# Patient Record
Sex: Male | Born: 1935 | Race: White | Hispanic: No | Marital: Married | Smoking: Former smoker
Health system: Southern US, Community
[De-identification: ages and names within clinical notes are randomized; demographics above are authoritative.]

## PROBLEM LIST (undated history)

## (undated) DIAGNOSIS — H9190 Unspecified hearing loss, unspecified ear: Secondary | ICD-10-CM

## (undated) DIAGNOSIS — C4490 Unspecified malignant neoplasm of skin, unspecified: Secondary | ICD-10-CM

## (undated) DIAGNOSIS — I10 Essential (primary) hypertension: Secondary | ICD-10-CM

## (undated) DIAGNOSIS — R0600 Dyspnea, unspecified: Secondary | ICD-10-CM

## (undated) DIAGNOSIS — K219 Gastro-esophageal reflux disease without esophagitis: Secondary | ICD-10-CM

## (undated) DIAGNOSIS — Z8719 Personal history of other diseases of the digestive system: Secondary | ICD-10-CM

## (undated) DIAGNOSIS — N400 Enlarged prostate without lower urinary tract symptoms: Secondary | ICD-10-CM

## (undated) DIAGNOSIS — E119 Type 2 diabetes mellitus without complications: Secondary | ICD-10-CM

## (undated) HISTORY — PX: APPENDECTOMY: SHX54

## (undated) HISTORY — PX: SKIN CANCER EXCISION: SHX779

---

## 2012-11-29 ENCOUNTER — Ambulatory Visit: Payer: Self-pay | Admitting: Gastroenterology

## 2012-11-29 LAB — COMPREHENSIVE METABOLIC PANEL
Alkaline Phosphatase: 63 U/L (ref 50–136)
Anion Gap: 6 — ABNORMAL LOW (ref 7–16)
BUN: 18 mg/dL (ref 7–18)
Bilirubin,Total: 0.4 mg/dL (ref 0.2–1.0)
Chloride: 104 mmol/L (ref 98–107)
EGFR (African American): >60
EGFR (Non-African Amer.): >60
Potassium: 3.9 mmol/L (ref 3.5–5.1)
SGOT(AST): 25 U/L (ref 15–37)
Sodium: 139 mmol/L (ref 136–145)

## 2012-11-29 LAB — CBC
HCT: 43.4 % (ref 40.0–52.0)
HGB: 15.2 g/dL (ref 13.0–18.0)
MCH: 30.5 pg (ref 26.0–34.0)
MCHC: 35.1 g/dL (ref 32.0–36.0)
Platelet: 197 10*3/uL (ref 150–440)
RBC: 5 10*6/uL (ref 4.40–5.90)
RDW: 13.7 % (ref 11.5–14.5)
WBC: 8.4 10*3/uL (ref 3.8–10.6)

## 2012-11-29 LAB — PROTIME-INR: Prothrombin Time: 12.8 secs (ref 11.5–14.7)

## 2012-11-29 LAB — APTT: Activated PTT: 27.9 secs (ref 23.6–35.9)

## 2012-11-29 LAB — CK TOTAL AND CKMB (NOT AT ARMC): CK-MB: 1.9 ng/mL (ref 0.5–3.6)

## 2012-11-29 LAB — TROPONIN I: Troponin-I: <0.02 ng/mL

## 2013-09-08 ENCOUNTER — Inpatient Hospital Stay: Payer: Self-pay | Admitting: Surgery

## 2013-09-08 LAB — COMPREHENSIVE METABOLIC PANEL
Alkaline Phosphatase: 67 U/L (ref 50–136)
Anion Gap: 5 — ABNORMAL LOW (ref 7–16)
Bilirubin,Total: 0.6 mg/dL (ref 0.2–1.0)
Calcium, Total: 8.9 mg/dL (ref 8.5–10.1)
Chloride: 103 mmol/L (ref 98–107)
Co2: 29 mmol/L (ref 21–32)
Creatinine: 0.99 mg/dL (ref 0.60–1.30)
Glucose: 148 mg/dL — ABNORMAL HIGH (ref 65–99)
Osmolality: 280 (ref 275–301)
Potassium: 3.5 mmol/L (ref 3.5–5.1)
SGOT(AST): 20 U/L (ref 15–37)
SGPT (ALT): 19 U/L (ref 12–78)
Total Protein: 7.2 g/dL (ref 6.4–8.2)

## 2013-09-08 LAB — URINALYSIS, COMPLETE
Blood: NEGATIVE
Glucose,UR: NEGATIVE mg/dL (ref 0–75)
Ketone: NEGATIVE
Leukocyte Esterase: NEGATIVE
RBC,UR: 3 /HPF (ref 0–5)
Specific Gravity: 1.018 (ref 1.003–1.030)
Squamous Epithelial: NONE SEEN

## 2013-09-08 LAB — CBC
HCT: 39 % — ABNORMAL LOW (ref 40.0–52.0)
MCHC: 35.4 g/dL (ref 32.0–36.0)
MCV: 86 fL (ref 80–100)
RDW: 13.5 % (ref 11.5–14.5)
WBC: 8.3 10*3/uL (ref 3.8–10.6)

## 2013-09-08 LAB — PROTIME-INR
INR: 1.1
Prothrombin Time: 14.1 secs (ref 11.5–14.7)

## 2013-09-08 LAB — LIPASE, BLOOD: Lipase: 166 U/L (ref 73–393)

## 2015-02-08 NOTE — Consult Note (Signed)
Brief Consult Note: Diagnosis: Esophageal foreign body impaction.   Patient was seen by consultant.   Consult note dictated.   Comments: Esophageal foreign body impaction.  Recommendations: EGD. Further recommendations to follow.  Electronic Signatures: Jill Side (MD)  (Signed 11-Feb-14 18:11)  Authored: Brief Consult Note   Last Updated: 11-Feb-14 18:11 by Jill Side (MD)

## 2015-02-08 NOTE — Consult Note (Signed)
PATIENT NAME:  Jeffery Avila, Jeffery Avila MR#:  [id number] DATE OF BIRTH:  03-07-1923  DATE OF CONSULTATION:  09/08/2013  PRIMARY CARE PHYSICIAN: From [facility] [place]. REFERRING PHYSICIAN:  Dr. Pat Patrick. CONSULTING PHYSICIAN:  Epifanio Lesches, MD  REASON FOR CONSULTATION: Medical management of hypertension and diabetes.   HISTORY OF PRESENT ILLNESS: An 81 year old male patient with hypertension, diabetes, admitted to surgical service for acute appendicitis. The patient has been having abdominal pain for the past 4 days, mainly in the right lower quadrant, not radiating to the back. No nausea. No vomiting. No diarrhea. The patient does not have any fever. The patient's pain is constant since the last 4 days but decided to come to the ER today, thought that it is just like a virus, and then he decided not to come. Because of persistent pain, he was brought in here. His CAT scan of the abdomen shows acute appendicitis and the patient is admitted to Dr. Rolin Barry service.   The patient right now is requesting to have the surgery as soon as possible and denies any other complaints.   PAST MEDICAL HISTORY: Significant for hypertension, diabetes.   ALLERGIES: No known allergies.   SOCIAL HISTORY: Denies any smoking. Occasional alcohol. No drug.   PAST SURGICAL HISTORY: History of surgery to his testicles.   MEDICATIONS: Aspirin 81 mg daily, hydrochlorothiazide 25 mg p.o. daily, lisinopril 40 mg p.o. daily, metformin 500 mg p.o. b.i.d.   FAMILY HISTORY: Noncontributory. Denies any hypertension or diabetes.   REVIEW OF SYSTEMS: CONSTITUTIONAL: Has no fever. No fatigue. No weakness.  EYES: No blurred vision and no glaucoma.  ENT: No tinnitus. No epistaxis. No difficulty swallowing.  RESPIRATORY: No cough. No wheezing.  CARDIOVASCULAR: No chest pain. No orthopnea.  GASTROINTESTINAL: Does have right lower quadrant abdominal pain for 4 days. No nausea. No vomiting. No fever. No constipation or diarrhea.  GENITOURINARY:  No dysuria.  ENDOCRINE: No polyuria or nocturia.  INTEGUMENTARY: No skin rashes.  MUSCULOSKELETAL: No joint pain.  NEUROLOGIC: No numbness or weakness. No stroke.  PSYCHIATRIC: No anxiety or insomnia.   PHYSICAL EXAMINATION:  VITAL SIGNS: Temperature is 98, heart rate is 176/74, respirations 20, blood pressure 98% on room air. He is alert, awake, oriented, not in distress, answers questions appropriately.  HEENT: Head atraumatic, normocephalic. Pupils equally reacting to light. Extraocular movements are intact.  ENT: No tympanic membrane congestion. No turbinate hypertrophy. No oropharynx erythema.  NECK: Normal range of motion. No JVD. No. carotid bruit. CARDIOVASCULAR: S1, S2 regular. No murmurs. PMI is not displaced. peripheral pulses  are intact. in dorsalis pedis,femoral artery and carotid. The patient has no peripheral edema.  LUNGS: Clear to auscultation. No wheeze. No rales.  GASTROINTESTINAL: Right lower quadrant tenderness present. No rebound tenderness. No organomegaly.  ABDOMEN: Diffusely distended. Bowel sounds are diminished.  MUSCULOSKELETAL: Normal gait and station. No pathology. No tenderness or effusion. Normal range of motion.  NEUROLOGIC: Cranial nerves II through XII intact. Power 5/5 upper and lower extremities. Sensation is intact. Deep tendon reflexes 2+ bilaterally.  PSYCHIATRIC: Mood and affect are within normal limits.   LABORATORY DATA: Urinalysis is yellow-colored urine, WBC 1, leukocyte esterase negative.   CT of the abdomen shows he patient is acute appendicitis. The appendix is markedly distended to a diameter of 4 cm. There are multiple fecaliths in the distended appendix. There is periappendiceal soft tissue stranding. No abscess or evidence of perforation.  The patient has a huge hiatal hernia. Almost the entire stomach is in the chest.  Liver, biliary tree, spleen, pancreas, and adrenal glands are normal. Kidneys are normal except for bilateral renal  cysts. There is a 7.6 cm cyst on the upper pole of the right kidney as well as a 2.1 cm cyst on the lower pole and a 9 mm cyst in the mid kidney. On the left there is a 13 mm cyst in the lateral aspect of the mid kidney.  There are numerous diverticula in the distal colon but there is no diverticulitis. Prostate gland is not enlarged. Bladder appears normal. No acute osseous abnormalities.   IMPRESSION: Acute appendicitis..   WBC 8.3, hemoglobin 13.8, hematocrit 39, platelets 196. Electrolytes: Sodium is 137, potassium 3.5, chloride 103, bicarb 29, BUN 29, creatinine 0.9 and glucose 148. INR is 1.   EKG: Normal sinus rhythm with no ST-T changes, 70 beats per minute.   ASSESSMENT AND PLAN:  1.  An 81 year old male patient with hypertension, diabetes, with acute appendicitis. The patient is at moderate risk for surgery given his hypertension, diabetes, and advanced age, but certainly can be performed. Recommend preoperative antibiotics.  2.  For hypertension, the patient's lisinopril and hydrochlorothiazide can be started tomorrow. For his blood pressure, we can use preoperative and perioperative beta blockers as you are already doing it. The patient is already on metoprolol 2.5 IV q.12 as he is n.p.o.  3.  For diabetes, he is on sliding scale with coverage and the patient did receive CT with contrast so he cannot have metformin for 48 hours. Till then continue sliding scale with coverage.  4.  Gastrointestinal prophylaxis and deep vein thrombosis prophylaxis. The patient will have  deep vein thrombosis prophylaxis as appropriate by surgery.   TIME SPENT: About 55 minutes on this consult.   Thank you for asking [place] to see this patient.   ____________________________ Epifanio Lesches, MD sk:np D: 09/08/2013 14:37:24 ET T: 09/08/2013 15:21:43 ET JOB#: [id number]  cc: Epifanio Lesches, MD, <Dictator> Epifanio Lesches MD ELECTRONICALLY SIGNED 09/27/2013 22:42

## 2015-02-08 NOTE — Op Note (Signed)
PATIENT NAME:  Jeffery, Avila MR#:  [id number] DATE OF BIRTH:  Feb 21, 1923  DATE OF PROCEDURE:  09/08/2013  PREOPERATIVE DIAGNOSIS: Acute appendicitis.   POSTOPERATIVE DIAGNOSIS: Acute appendicitis.   PROCEDURE: Laparoscopic appendectomy.   SURGEON: Jovin Fester E. Excell Seltzer, M.D.   ANESTHESIA: General with endotracheal tube.   INDICATIONS: This is an elderly and man with a dilated appendix on CT scan, who presents was right lower quadrant pain, tenderness and peritoneal signs with a normal white blood cell count. Preoperatively, we discussed rationale for surgery, the options of observation, risk of bleeding, infection, negative laparoscopy, conversion to an open procedure and additional surgery for alternative sources. This was reviewed for him and his sons in the preop holding area. They understood and agreed to proceed.   FINDINGS: Very dilated base of the appendix.  The tip of the appendix appeared injected and acutely inflamed. There was no mass palpable in the appendix, but the base of the appendix was very dilated to the base of the cecum, requiring imbrication with an Endo Stitch Vicryl suture.   DESCRIPTION OF PROCEDURE: The patient was induced with general anesthesia. VTE prophylaxis was in place. He was given IV antibiotics. Foley catheter was placed, and he was prepped and draped in a sterile fashion. Marcaine was infiltrated in skin and subcutaneous tissues around the periumbilical area. Incision was made. Veress needle was placed. Pneumoperitoneum was obtained, and a 5 mm trocar port was placed. The abdominal cavity was explored, and under direct vision, a 12 mm left lateral port was placed and a 5 mm suprapubic port was placed as well. With the camera in the suprapubic area looking back at the periumbilical site, there was a single small bowel adhesion in the supraumbilical area not involved with the port site, and no sign of bowel injury.   Camera was replaced into the periumbilical site.  The appendix was identified in the right lower quadrant, and a masslike effect was noted at the base of the appendix, but palpation of this laparoscopically demonstrated that it was soft and fluid-filled, and it was not a solid mass. Dissection of the appendix was performed, and the mesoappendix was divided with multiple loads of the vascular load Endo GIA, and then the base of the appendix was divided with an Endo GIA standard load, and the specimen was passed out through the lateral port site with the aid of an Endo Catch bag. The area was checked for hemostasis and found to be adequate.   The specimen was inspected and found to have no mass effect. The tip of the appendix was inflamed. The base of the appendix was very dilated, but there was no mass. It was fluid-filled.   Attention was returned to the right lower quadrant laparoscopically. Again, hemostasis was found to be adequate. There were some serosal thickening around the staple line, and this was utilized to imbricate over the top of the large staple Technical brewer with 0 silk imbricating suture.   There was no sign of bowel leak or bleeding at this point. The area was irrigated with copious amounts of normal saline. There was no sign of leak or bleeding at this point. The left lateral site was closed with multiple 0 Vicryl sutures utilizing an Endo Close technique. The area again checked for hemostasis, found to be adequate, then all ports were removed as pneumoperitoneum was released, and then 4-0 subcuticular Monocryl was used on all skin edges. Steri-Strips, Mastisol and sterile dressings were placed. A  total of 30 mL of Marcaine had been utilized.   The patient tolerated the procedure well. There were no complications. He was taken to the recovery room in stable condition to be admitted for continued care.     ____________________________ [name]. Excell Seltzer, MD rec:dmm D: 09/08/2013 22:12:00 ET T: 09/08/2013  22:40:25 ET JOB#: [id number]  cc: [name]. Excell Seltzer, MD, <Dictator> [name] MD ELECTRONICALLY SIGNED 09/09/2013 0:44

## 2015-02-08 NOTE — Consult Note (Signed)
PATIENT NAME:  Jeffery Avila, Jeffery Avila MR#:  [id number] DATE OF BIRTH:  1923/09/13  DATE OF CONSULTATION:  11/29/2012  REFERRING PHYSICIAN:   CONSULTING PHYSICIAN:  Jill Side, MD  CHIEF COMPLAINT:  Esophageal foreign body impaction.   HISTORY OF PRESENT ILLNESS:  An 81 year old white male with history of hypertension, diabetes, hyperlipidemia and previous history of food bolus impaction about 5 years ago presented to the Emergency Room today complaining of acute food bolus impaction. Apparently, the patient was eating chicken and felt a piece had lodged into the lower esophageal area. He was unable to eat or drink anything afterwards. The patient was given some water in the Emergency Room which he immediately regurgitated. The patient has received a dose of glucagon without any significant improvement. He denies any chest pain or shortness of breath. He denies any other significant problems. He denies intermittent or chronic dysphagia, although as mentioned above, he had food bolus impaction several years ago and may have had his esophagus stretched in the past as well.   PAST MEDICAL HISTORY:  Hypertension, diabetes and hyperlipidemia.   ALLERGIES:  None.   MEDICATIONS AT HOME:  Include simvastatin, lisinopril and hydrochlorothiazide.   SOCIAL HISTORY:  Does not smoke or drink.   FAMILY HISTORY:  Unremarkable.   REVIEW OF SYSTEMS:  Grossly negative except for what is mentioned in the history of present illness.   PHYSICAL EXAMINATION: GENERAL: Somewhat obese male who does not appear to be in any acute distress.  VITAL SIGNS: His blood pressure was somewhat high about 200/90 in the Emergency Room.  SKIN: Shows some chronic rash on his face and extremities.  HEENT: Examination is otherwise unremarkable.  NECK: Veins are flat.  LUNGS: Grossly clear to auscultation bilaterally with fair air entry.  CARDIOVASCULAR: Regular rate and rhythm, a 2/6 systolic murmur.  ABDOMEN: Fairly benign  abdomen which is soft, somewhat distended, but nontender. No rebound or guarding was noted.  NEUROLOGIC: Examination appears to be unremarkable.   LABORATORY DATA:  Troponin is less than 0.02. Electrolytes, BUN, creatinine and liver enzymes are normal. CBC is within normal limits.   ASSESSMENT AND PLAN:  The patient with what appears to be a food bolus impaction. The patient does have a similar history in the past and we may be dealing with a case of Schatzki's ring versus erosive esophagitis versus an esophageal stricture. The patient otherwise does not appear to be in any distress, although he failed to swallow liquids on more than one occasion within the last couple of hours. The patient did not have any significant response to glucagon as well. We will proceed with an upper GI endoscopy. The procedure of upper GI endoscopy was discussed with the patient in detail and he is in full agreement. Further recommendations are to follow.    ____________________________ Jill Side, MD si:si D: 11/29/2012 18:10:20 ET T: 11/29/2012 21:28:33 ET JOB#: [id number]  cc: Jill Side, MD, <Dictator> Jill Side MD ELECTRONICALLY SIGNED 12/02/2012 11:10

## 2015-02-08 NOTE — Consult Note (Signed)
Chief Complaint:  Subjective/Chief Complaint EGD done and food bolus removed. Severe erosive esophagitis and gastritis. Large hiatal hernia.  Recommendations: Prilosec 20 mg bid. Follow up with me in 4 weeks. Chew food well.   Electronic Signatures: Jill Side (MD)  (Signed 11-Feb-14 18:55)  Authored: Chief Complaint   Last Updated: 11-Feb-14 18:55 by Jill Side (MD)

## 2015-07-05 ENCOUNTER — Emergency Department
Admission: EM | Admit: 2015-07-05 | Discharge: 2015-07-05 | Disposition: A | Discharge status: Home / Self Care | Payer: Medicare Other | Attending: Emergency Medicine | Admitting: Emergency Medicine

## 2015-07-05 DIAGNOSIS — T2050XA Corrosion of first degree of head, face, and neck, unspecified site, initial encounter: Secondary | ICD-10-CM | POA: Insufficient documentation

## 2015-07-05 DIAGNOSIS — Y9389 Activity, other specified: Secondary | ICD-10-CM | POA: Insufficient documentation

## 2015-07-05 DIAGNOSIS — X58XXXA Exposure to other specified factors, initial encounter: Secondary | ICD-10-CM | POA: Diagnosis not present

## 2015-07-05 DIAGNOSIS — T5191XA Toxic effect of unspecified alcohol, accidental (unintentional), initial encounter: Secondary | ICD-10-CM | POA: Insufficient documentation

## 2015-07-05 DIAGNOSIS — Y9289 Other specified places as the place of occurrence of the external cause: Secondary | ICD-10-CM | POA: Insufficient documentation

## 2015-07-05 DIAGNOSIS — Y998 Other external cause status: Secondary | ICD-10-CM | POA: Diagnosis not present

## 2015-07-05 DIAGNOSIS — L243 Irritant contact dermatitis due to cosmetics: Secondary | ICD-10-CM | POA: Diagnosis not present

## 2015-07-05 DIAGNOSIS — T2040XA Corrosion of unspecified degree of head, face, and neck, unspecified site, initial encounter: Secondary | ICD-10-CM

## 2015-07-05 DIAGNOSIS — R51 Headache: Secondary | ICD-10-CM | POA: Diagnosis present

## 2015-07-05 MED ORDER — SILVER SULFADIAZINE 1 % EX CREA: TOPICAL_CREAM | CUTANEOUS | Status: DC

## 2015-07-05 MED ORDER — SILVER SULFADIAZINE 1 % EX CREA
TOPICAL_CREAM | Freq: Once | CUTANEOUS | Status: AC
  Administered 2015-07-05: 13:00:00 via TOPICAL
  Filled 2015-07-05: qty 85

## 2015-07-05 NOTE — ED Provider Notes (Signed)
[facility] Emergency Department [place] Note ____________________________________________  Time seen: 1235  I have reviewed the triage vital signs and the nursing notes.  HISTORY  Chief Complaint  Facial Pain  HPI Jeffery Avila is a 81 y.o. male reports to the ED, under his own care for evaluation of some irritation to his face. He's been seeing a dermatologist for a rash to his face, and has been applying a "cancer cream" for the last 2 weeks. 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 At some point doctor told him to discontinue the cream. This morning he went to the drugstore to find some over-the-counter cream for . He was directed to use aloe vera gel, but unbeknownst to the patient, it has a high alcohol content. This in turn caused some significant redness and burning to his face. He is here today for evaluation treatment of this irritation to his face. He denies any fever, chills, or sweats. He did attempt to see the dermatologist, but the office is closed on Fridays.  No past medical history on file.  There are no active problems to display for this patient.  No past surgical history on file.  Current Outpatient Rx  Name  Route  Sig  Dispense  Refill  . silver sulfADIAZINE (SILVADENE) 1 % cream      Apply to affected area daily   25 g   0    Allergies Review of patient's allergies indicates no known allergies.  No family history on file.  Social History Social History  Substance Use Topics  . Smoking status: Not on file  . Smokeless tobacco: Not on file  . Alcohol Use: Not on file   Review of Systems  Constitutional: Negative for fever. Eyes: Negative for visual changes. ENT: Negative for sore throat. Cardiovascular: Negative for chest pain. Respiratory: Negative for shortness of breath. Gastrointestinal: Negative for abdominal pain, vomiting and diarrhea. Genitourinary: Negative for  dysuria. Musculoskeletal: Negative for back pain. Skin: Negative for rash. Facial burning & irritation. Neurological: Negative for headaches, focal weakness or numbness. ____________________________________________  PHYSICAL EXAM:  VITAL SIGNS: ED Triage Vitals  Enc Vitals Group     BP 07/05/15 1230 143/73 mmHg     Pulse Rate 07/05/15 1230 100     Resp 07/05/15 1230 18     Temp 07/05/15 1230 97.7 F (36.5 C)     Temp Source 07/05/15 1230 Oral     SpO2 07/05/15 1230 97 %     Weight 07/05/15 1230 195 lb (88.451 kg)     Height 07/05/15 1230 5\' 9"  (1.753 m)     Head Cir --      Peak Flow --      Pain Score 07/05/15 1231 10     Pain Loc --      Pain Edu? --      Excl. in [place]? --    Constitutional: Alert and oriented. Well appearing and in no distress. Eyes: Conjunctivae are normal. PERRL. Normal extraocular movements. ENT   Head: Normocephalic and atraumatic.   Nose: No congestion/rhinorrhea.   Mouth/Throat: Mucous membranes are moist.   Neck: Supple. No thyromegaly. Hematological/Lymphatic/Immunological: No cervical lymphadenopathy. Cardiovascular: Normal rate, regular rhythm.  Respiratory: Normal respiratory effort. No wheezes/rales/rhonchi. Gastrointestinal: Soft and nontender. No distention. Musculoskeletal: Nontender with normal range of motion in all extremities.  Neurologic:  Normal gait without ataxia. Normal speech and language. No gross focal neurologic deficits are appreciated. Skin:  Skin  is warm, dry and intact. No rash noted. Face with erythema and irritation noted globally, except the eyelids. No open lesions or abrasion noted.  Psychiatric: Mood and affect are normal. Patient exhibits appropriate insight and judgment. ____________________________________________  PROCEDURES  Silvadene 1% cream applied to the face with immediate relief of burning.  ____________________________________________  INITIAL IMPRESSION / ASSESSMENT AND PLAN / ED  COURSE  First-degree burn to the face likely due to chemical irritation of the denatured alcohol in the previously applied aloe vera gel. Patient will be prescribed Silvadene cream to apply to the face twice daily as needed. He is to follow with the dermatologist on Monday for further treatment and management. ____________________________________________  FINAL CLINICAL IMPRESSION(S) / ED DIAGNOSES  Final diagnoses:  Chemical burn of face, initial encounter  Irritant contact dermatitis due to cosmetics      Melvenia Needles, PA-C 07/05/15 [place], MD 07/05/15 [phone]

## 2015-07-05 NOTE — ED Notes (Signed)
Pt ambulatory to triage with reports that he has been seeing a dermatologist for a rash on his face, reports he had been putting a cream on it for 2 weeks and the doctor told him to stop using it. Pt went to drug store this am and bought aloe to put on and since then pt has been having burning of his face.

## 2015-07-05 NOTE — Discharge Instructions (Signed)

## 2017-04-26 ENCOUNTER — Encounter: Payer: Self-pay | Admitting: Radiation Oncology

## 2017-04-26 ENCOUNTER — Ambulatory Visit
Admission: RE | Admit: 2017-04-26 | Discharge: 2017-04-26 | Disposition: A | Discharge status: Home / Self Care | Payer: Medicare Other | Source: Ambulatory Visit | Attending: Radiation Oncology | Admitting: Radiation Oncology

## 2017-04-26 VITALS — BP 119/64 | HR 86 | Resp 20 | Wt 192.2 lb

## 2017-04-26 DIAGNOSIS — Z79899 Other long term (current) drug therapy: Secondary | ICD-10-CM | POA: Diagnosis not present

## 2017-04-26 DIAGNOSIS — C44319 Basal cell carcinoma of skin of other parts of face: Secondary | ICD-10-CM | POA: Diagnosis not present

## 2017-04-26 DIAGNOSIS — Z87891 Personal history of nicotine dependence: Secondary | ICD-10-CM | POA: Diagnosis not present

## 2017-04-26 DIAGNOSIS — Z510 Encounter for antineoplastic radiation therapy: Secondary | ICD-10-CM | POA: Insufficient documentation

## 2017-04-26 DIAGNOSIS — C189 Malignant neoplasm of colon, unspecified: Secondary | ICD-10-CM | POA: Insufficient documentation

## 2017-04-26 DIAGNOSIS — C44310 Basal cell carcinoma of skin of unspecified parts of face: Secondary | ICD-10-CM

## 2017-04-26 DIAGNOSIS — Z7984 Long term (current) use of oral hypoglycemic drugs: Secondary | ICD-10-CM | POA: Insufficient documentation

## 2017-04-26 HISTORY — DX: Unspecified malignant neoplasm of skin, unspecified: C44.90

## 2017-04-26 NOTE — Consult Note (Signed)
NEW PATIENT EVALUATION  Name: Jeffery Avila  MRN: [id number]  Date:   04/26/2017     DOB: 28-Sep-1923   This 83 y.o. 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 provider found  CHIEF COMPLAINT:  Chief Complaint  Patient presents with  . Cancer    Pt is here for initial consulation of basal cell carcinoma.      DIAGNOSIS: The encounter diagnosis was Basal cell carcinoma of skin of face, unspecified part of face.   PREVIOUS INVESTIGATIONS:  Pathology reports reviewed Medical notes reviewed  HPI: Patient is a 83 year old male with stage IV colon cancer who is been treated at [facility] dermatology for many years. He's had multiple excisions of basal cell carcinomas 3 of which have now presented with a problem requiring further treatment. He has a significant one in his right ear causing significant pain for which she does take Aleve. He also has one of the dorsum of his nose as well as his left jawline. Nose and jawline or not causing pain at this time. He has been seen by dermatology and they have requested appointment with radiation oncology for treatment. He is seen today accompanied by his son. He does have a prescription for tramadol provided by [facility] although is taking only Aleve at this time.  PLANNED TREATMENT REGIMEN: Electron beam therapy  PAST MEDICAL HISTORY:  has a past medical history of Skin cancer.    PAST SURGICAL HISTORY: No past surgical history on file.  FAMILY HISTORY: family history is not on file.  SOCIAL HISTORY:  reports that he has quit smoking. He has never used smokeless tobacco.  ALLERGIES: Patient has no known allergies.  MEDICATIONS:  Current Outpatient Prescriptions  Medication Sig Dispense Refill  . finasteride (PROSCAR) 5 MG tablet Take 5 mg by mouth daily.    . hydrochlorothiazide  (HYDRODIURIL) 25 MG tablet Take 25 mg by mouth.    Marland Kitchen lisinopril (PRINIVIL,ZESTRIL) 40 MG tablet Take 40 mg by mouth.    . metFORMIN (GLUCOPHAGE) 500 MG tablet Take by mouth daily with breakfast.    . simvastatin (ZOCOR) 80 MG tablet Take 80 mg by mouth.    . traMADol (ULTRAM) 50 MG tablet Take 50 mg by mouth.    . calcipotriene (DOVONOX) 0.005 % cream Apply once daily for 4 consecutive days each month.    . fluorouracil (EFUDEX) 5 % cream Apply once daily for 4 consecutive days each month.    . mirabegron ER (MYRBETRIQ) 25 MG TB24 tablet Take 25 mg by mouth.    . silver sulfADIAZINE (SILVADENE) 1 % cream Apply to affected area daily (Patient not taking: Reported on 04/26/2017) 25 g 0   No current facility-administered medications for this encounter.     ECOG PERFORMANCE STATUS:  1 - Symptomatic but completely ambulatory  REVIEW OF SYSTEMS: Patient does have known stage IV colon cancer no symptoms at this time. Patient is hard of hearing Patient denies any weight loss, fatigue, weakness, fever, chills or night sweats. Patient denies any loss of vision, blurred vision. Patient denies any ringing  of the ears or hearing loss. No irregular heartbeat. Patient denies heart murmur or history of fainting. Patient denies any chest pain or pain radiating to her upper extremities. Patient denies any shortness of breath, difficulty breathing at night, cough or hemoptysis. Patient denies any swelling  in the lower legs. Patient denies any nausea vomiting, vomiting of blood, or coffee ground material in the vomitus. Patient denies any stomach pain. Patient states has had normal bowel movements no significant constipation or diarrhea. Patient denies any dysuria, hematuria or significant nocturia. Patient denies any problems walking, swelling in the joints or loss of balance. Patient denies any skin changes, loss of hair or loss of weight. Patient denies any excessive worrying or anxiety or significant depression.  Patient denies any problems with insomnia. Patient denies excessive thirst, polyuria, polydipsia. Patient denies any swollen glands, patient denies easy bruising or easy bleeding. Patient denies any recent infections, allergies or URI. Patient "s visual fields have not changed significantly in recent time.    PHYSICAL EXAM: BP 119/64   Pulse 86   Resp 20   Wt 192 lb 3.9 oz (87.2 kg)   BMI 28.39 kg/m  Patient is ulcerated lesion of his right ear. Also has a flattened excised lesion of his dorsum of his nose as well as the left jawline. No evidence of subject gastric cervical or supraclavicular adenopathy is identified. Well-developed well-nourished patient in NAD. HEENT reveals PERLA, EOMI, discs not visualized.  Oral cavity is clear. No oral mucosal lesions are identified. Neck is clear without evidence of cervical or supraclavicular adenopathy. Lungs are clear to A&P. Cardiac examination is essentially unremarkable with regular rate and rhythm without murmur rub or thrill. Abdomen is benign with no organomegaly or masses noted. Motor sensory and DTR levels are equal and symmetric in the upper and lower extremities. Cranial nerves II through XII are grossly intact. Proprioception is intact. No peripheral adenopathy or edema is identified. No motor or sensory levels are noted. Crude visual fields are within normal range.  LABORATORY DATA: Pathology reports requested for my review    RADIOLOGY RESULTS: No current films for review   IMPRESSION: Multiple basal cell carcinomas of the face and right ear in 83 year old male with known stage IV colon cancer  PLAN: I like to first treat his right ear since this is most symptomatic lesion causing significant pain with ulceration. I would plan on delivering 5000 cGy over 5 weeks using electron beam therapy. Other areas we can tackle down the road should date again continue to progress or cause significant pain. I've [place] 7 ordered CT simulation for  later this week. Risks and benefits of treatment including skin reaction fatigue all discussed in detail with the patient and his son. They both seem to comprehend my treatment plan well.  I would like to take this opportunity to thank you for allowing me to participate in the care of your patient.Armstead Peaks., MD

## 2017-04-29 ENCOUNTER — Ambulatory Visit
Admission: RE | Admit: 2017-04-29 | Discharge: 2017-04-29 | Disposition: A | Discharge status: Home / Self Care | Payer: Medicare Other | Source: Ambulatory Visit | Attending: Radiation Oncology | Admitting: Radiation Oncology

## 2017-04-29 DIAGNOSIS — C44319 Basal cell carcinoma of skin of other parts of face: Secondary | ICD-10-CM | POA: Diagnosis not present

## 2017-05-04 DIAGNOSIS — C44319 Basal cell carcinoma of skin of other parts of face: Secondary | ICD-10-CM | POA: Diagnosis not present

## 2017-05-10 ENCOUNTER — Ambulatory Visit
Admission: RE | Admit: 2017-05-10 | Discharge: 2017-05-10 | Disposition: A | Discharge status: Home / Self Care | Payer: Medicare Other | Source: Ambulatory Visit | Attending: Radiation Oncology | Admitting: Radiation Oncology

## 2017-05-10 DIAGNOSIS — C44319 Basal cell carcinoma of skin of other parts of face: Secondary | ICD-10-CM | POA: Diagnosis not present

## 2017-05-11 ENCOUNTER — Ambulatory Visit
Admission: RE | Admit: 2017-05-11 | Discharge: 2017-05-11 | Disposition: A | Discharge status: Home / Self Care | Payer: Medicare Other | Source: Ambulatory Visit | Attending: Radiation Oncology | Admitting: Radiation Oncology

## 2017-05-11 DIAGNOSIS — C44319 Basal cell carcinoma of skin of other parts of face: Secondary | ICD-10-CM | POA: Diagnosis not present

## 2017-05-12 ENCOUNTER — Ambulatory Visit
Admission: RE | Admit: 2017-05-12 | Discharge: 2017-05-12 | Disposition: A | Discharge status: Home / Self Care | Payer: Medicare Other | Source: Ambulatory Visit | Attending: Radiation Oncology | Admitting: Radiation Oncology

## 2017-05-12 DIAGNOSIS — C44319 Basal cell carcinoma of skin of other parts of face: Secondary | ICD-10-CM | POA: Diagnosis not present

## 2017-05-13 ENCOUNTER — Ambulatory Visit
Admission: RE | Admit: 2017-05-13 | Discharge: 2017-05-13 | Disposition: A | Discharge status: Home / Self Care | Payer: Medicare Other | Source: Ambulatory Visit | Attending: Radiation Oncology | Admitting: Radiation Oncology

## 2017-05-13 DIAGNOSIS — C44319 Basal cell carcinoma of skin of other parts of face: Secondary | ICD-10-CM | POA: Diagnosis not present

## 2017-05-14 ENCOUNTER — Ambulatory Visit
Admission: RE | Admit: 2017-05-14 | Discharge: 2017-05-14 | Disposition: A | Discharge status: Home / Self Care | Payer: Medicare Other | Source: Ambulatory Visit | Attending: Radiation Oncology | Admitting: Radiation Oncology

## 2017-05-14 DIAGNOSIS — C44319 Basal cell carcinoma of skin of other parts of face: Secondary | ICD-10-CM | POA: Diagnosis not present

## 2017-05-17 ENCOUNTER — Ambulatory Visit
Admission: RE | Admit: 2017-05-17 | Discharge: 2017-05-17 | Disposition: A | Discharge status: Home / Self Care | Payer: Medicare Other | Source: Ambulatory Visit | Attending: Radiation Oncology | Admitting: Radiation Oncology

## 2017-05-17 DIAGNOSIS — C44319 Basal cell carcinoma of skin of other parts of face: Secondary | ICD-10-CM | POA: Diagnosis not present

## 2017-05-18 ENCOUNTER — Ambulatory Visit
Admission: RE | Admit: 2017-05-18 | Discharge: 2017-05-18 | Disposition: A | Discharge status: Home / Self Care | Payer: Medicare Other | Source: Ambulatory Visit | Attending: Radiation Oncology | Admitting: Radiation Oncology

## 2017-05-18 DIAGNOSIS — C44319 Basal cell carcinoma of skin of other parts of face: Secondary | ICD-10-CM | POA: Diagnosis not present

## 2017-05-19 ENCOUNTER — Ambulatory Visit
Admission: RE | Admit: 2017-05-19 | Discharge: 2017-05-19 | Disposition: A | Discharge status: Home / Self Care | Payer: Medicare Other | Source: Ambulatory Visit | Attending: Radiation Oncology | Admitting: Radiation Oncology

## 2017-05-19 DIAGNOSIS — C44319 Basal cell carcinoma of skin of other parts of face: Secondary | ICD-10-CM | POA: Diagnosis not present

## 2017-05-20 ENCOUNTER — Ambulatory Visit
Admission: RE | Admit: 2017-05-20 | Discharge: 2017-05-20 | Disposition: A | Discharge status: Home / Self Care | Payer: Medicare Other | Source: Ambulatory Visit | Attending: Radiation Oncology | Admitting: Radiation Oncology

## 2017-05-20 DIAGNOSIS — C44319 Basal cell carcinoma of skin of other parts of face: Secondary | ICD-10-CM | POA: Diagnosis not present

## 2017-05-21 ENCOUNTER — Ambulatory Visit: Payer: Medicare Other

## 2017-05-24 ENCOUNTER — Ambulatory Visit
Admission: RE | Admit: 2017-05-24 | Discharge: 2017-05-24 | Disposition: A | Discharge status: Home / Self Care | Payer: Medicare Other | Source: Ambulatory Visit | Attending: Radiation Oncology | Admitting: Radiation Oncology

## 2017-05-24 DIAGNOSIS — C44319 Basal cell carcinoma of skin of other parts of face: Secondary | ICD-10-CM | POA: Diagnosis not present

## 2017-05-25 ENCOUNTER — Ambulatory Visit
Admission: RE | Admit: 2017-05-25 | Discharge: 2017-05-25 | Disposition: A | Discharge status: Home / Self Care | Payer: Medicare Other | Source: Ambulatory Visit | Attending: Radiation Oncology | Admitting: Radiation Oncology

## 2017-05-25 DIAGNOSIS — C44319 Basal cell carcinoma of skin of other parts of face: Secondary | ICD-10-CM | POA: Diagnosis not present

## 2017-05-26 ENCOUNTER — Ambulatory Visit
Admission: RE | Admit: 2017-05-26 | Discharge: 2017-05-26 | Disposition: A | Discharge status: Home / Self Care | Payer: Medicare Other | Source: Ambulatory Visit | Attending: Radiation Oncology | Admitting: Radiation Oncology

## 2017-05-26 DIAGNOSIS — C44319 Basal cell carcinoma of skin of other parts of face: Secondary | ICD-10-CM | POA: Diagnosis not present

## 2017-05-27 ENCOUNTER — Ambulatory Visit
Admission: RE | Admit: 2017-05-27 | Discharge: 2017-05-27 | Disposition: A | Discharge status: Home / Self Care | Payer: Medicare Other | Source: Ambulatory Visit | Attending: Radiation Oncology | Admitting: Radiation Oncology

## 2017-05-27 DIAGNOSIS — C44319 Basal cell carcinoma of skin of other parts of face: Secondary | ICD-10-CM | POA: Diagnosis not present

## 2017-05-28 ENCOUNTER — Ambulatory Visit
Admission: RE | Admit: 2017-05-28 | Discharge: 2017-05-28 | Disposition: A | Discharge status: Home / Self Care | Payer: Medicare Other | Source: Ambulatory Visit | Attending: Radiation Oncology | Admitting: Radiation Oncology

## 2017-05-28 DIAGNOSIS — C44319 Basal cell carcinoma of skin of other parts of face: Secondary | ICD-10-CM | POA: Diagnosis not present

## 2017-05-31 ENCOUNTER — Ambulatory Visit
Admission: RE | Admit: 2017-05-31 | Discharge: 2017-05-31 | Disposition: A | Discharge status: Home / Self Care | Payer: Medicare Other | Source: Ambulatory Visit | Attending: Radiation Oncology | Admitting: Radiation Oncology

## 2017-05-31 DIAGNOSIS — C44319 Basal cell carcinoma of skin of other parts of face: Secondary | ICD-10-CM | POA: Diagnosis not present

## 2017-06-01 ENCOUNTER — Ambulatory Visit
Admission: RE | Admit: 2017-06-01 | Discharge: 2017-06-01 | Disposition: A | Discharge status: Home / Self Care | Payer: Medicare Other | Source: Ambulatory Visit | Attending: Radiation Oncology | Admitting: Radiation Oncology

## 2017-06-01 DIAGNOSIS — C44319 Basal cell carcinoma of skin of other parts of face: Secondary | ICD-10-CM | POA: Diagnosis not present

## 2017-06-02 ENCOUNTER — Ambulatory Visit
Admission: RE | Admit: 2017-06-02 | Discharge: 2017-06-02 | Disposition: A | Discharge status: Home / Self Care | Payer: Medicare Other | Source: Ambulatory Visit | Attending: Radiation Oncology | Admitting: Radiation Oncology

## 2017-06-02 DIAGNOSIS — C44319 Basal cell carcinoma of skin of other parts of face: Secondary | ICD-10-CM | POA: Diagnosis not present

## 2017-06-03 ENCOUNTER — Ambulatory Visit
Admission: RE | Admit: 2017-06-03 | Discharge: 2017-06-03 | Disposition: A | Discharge status: Home / Self Care | Payer: Medicare Other | Source: Ambulatory Visit | Attending: Radiation Oncology | Admitting: Radiation Oncology

## 2017-06-03 DIAGNOSIS — C44319 Basal cell carcinoma of skin of other parts of face: Secondary | ICD-10-CM | POA: Diagnosis not present

## 2017-06-04 ENCOUNTER — Ambulatory Visit
Admission: RE | Admit: 2017-06-04 | Discharge: 2017-06-04 | Disposition: A | Discharge status: Home / Self Care | Payer: Medicare Other | Source: Ambulatory Visit | Attending: Radiation Oncology | Admitting: Radiation Oncology

## 2017-06-04 DIAGNOSIS — C44319 Basal cell carcinoma of skin of other parts of face: Secondary | ICD-10-CM | POA: Diagnosis not present

## 2017-06-07 ENCOUNTER — Ambulatory Visit
Admission: RE | Admit: 2017-06-07 | Discharge: 2017-06-07 | Disposition: A | Discharge status: Home / Self Care | Payer: Medicare Other | Source: Ambulatory Visit | Attending: Radiation Oncology | Admitting: Radiation Oncology

## 2017-06-07 DIAGNOSIS — C44319 Basal cell carcinoma of skin of other parts of face: Secondary | ICD-10-CM | POA: Diagnosis not present

## 2017-06-08 ENCOUNTER — Ambulatory Visit
Admission: RE | Admit: 2017-06-08 | Discharge: 2017-06-08 | Disposition: A | Discharge status: Home / Self Care | Payer: Medicare Other | Source: Ambulatory Visit | Attending: Radiation Oncology | Admitting: Radiation Oncology

## 2017-06-08 DIAGNOSIS — C44319 Basal cell carcinoma of skin of other parts of face: Secondary | ICD-10-CM | POA: Diagnosis not present

## 2017-06-09 ENCOUNTER — Ambulatory Visit
Admission: RE | Admit: 2017-06-09 | Discharge: 2017-06-09 | Disposition: A | Discharge status: Home / Self Care | Payer: Medicare Other | Source: Ambulatory Visit | Attending: Radiation Oncology | Admitting: Radiation Oncology

## 2017-06-09 DIAGNOSIS — C44319 Basal cell carcinoma of skin of other parts of face: Secondary | ICD-10-CM | POA: Diagnosis not present

## 2017-06-10 ENCOUNTER — Ambulatory Visit
Admission: RE | Admit: 2017-06-10 | Discharge: 2017-06-10 | Disposition: A | Discharge status: Home / Self Care | Payer: Medicare Other | Source: Ambulatory Visit | Attending: Radiation Oncology | Admitting: Radiation Oncology

## 2017-06-10 DIAGNOSIS — C44319 Basal cell carcinoma of skin of other parts of face: Secondary | ICD-10-CM | POA: Diagnosis not present

## 2017-06-11 ENCOUNTER — Ambulatory Visit
Admission: RE | Admit: 2017-06-11 | Discharge: 2017-06-11 | Disposition: A | Discharge status: Home / Self Care | Payer: Medicare Other | Source: Ambulatory Visit | Attending: Radiation Oncology | Admitting: Radiation Oncology

## 2017-06-11 DIAGNOSIS — C44319 Basal cell carcinoma of skin of other parts of face: Secondary | ICD-10-CM | POA: Diagnosis not present

## 2017-06-14 ENCOUNTER — Ambulatory Visit
Admission: RE | Admit: 2017-06-14 | Discharge: 2017-06-14 | Disposition: A | Discharge status: Home / Self Care | Payer: Medicare Other | Source: Ambulatory Visit | Attending: Radiation Oncology | Admitting: Radiation Oncology

## 2017-06-14 ENCOUNTER — Ambulatory Visit: Payer: Medicare Other

## 2017-06-14 DIAGNOSIS — C44319 Basal cell carcinoma of skin of other parts of face: Secondary | ICD-10-CM | POA: Diagnosis not present

## 2017-06-15 ENCOUNTER — Ambulatory Visit: Payer: Medicare Other

## 2017-06-16 ENCOUNTER — Ambulatory Visit: Payer: Medicare Other

## 2017-06-16 ENCOUNTER — Ambulatory Visit
Admission: RE | Admit: 2017-06-16 | Discharge: 2017-06-16 | Disposition: A | Discharge status: Home / Self Care | Payer: Medicare Other | Source: Ambulatory Visit | Attending: Radiation Oncology | Admitting: Radiation Oncology

## 2017-06-16 DIAGNOSIS — C44319 Basal cell carcinoma of skin of other parts of face: Secondary | ICD-10-CM | POA: Diagnosis not present

## 2017-06-17 ENCOUNTER — Ambulatory Visit
Admission: RE | Admit: 2017-06-17 | Discharge: 2017-06-17 | Disposition: A | Discharge status: Home / Self Care | Payer: Medicare Other | Source: Ambulatory Visit | Attending: Radiation Oncology | Admitting: Radiation Oncology

## 2017-06-17 DIAGNOSIS — C44319 Basal cell carcinoma of skin of other parts of face: Secondary | ICD-10-CM | POA: Diagnosis not present

## 2017-06-18 ENCOUNTER — Ambulatory Visit
Admission: RE | Admit: 2017-06-18 | Discharge: 2017-06-18 | Disposition: A | Discharge status: Home / Self Care | Payer: Medicare Other | Source: Ambulatory Visit | Attending: Radiation Oncology | Admitting: Radiation Oncology

## 2017-06-18 DIAGNOSIS — C44319 Basal cell carcinoma of skin of other parts of face: Secondary | ICD-10-CM | POA: Diagnosis not present

## 2017-06-22 ENCOUNTER — Ambulatory Visit
Admission: RE | Admit: 2017-06-22 | Discharge: 2017-06-22 | Disposition: A | Discharge status: Home / Self Care | Payer: Medicare Other | Source: Ambulatory Visit | Attending: Radiation Oncology | Admitting: Radiation Oncology

## 2017-06-22 ENCOUNTER — Ambulatory Visit: Payer: Medicare Other

## 2017-06-22 DIAGNOSIS — C44319 Basal cell carcinoma of skin of other parts of face: Secondary | ICD-10-CM | POA: Diagnosis not present

## 2017-06-23 ENCOUNTER — Ambulatory Visit
Admission: RE | Admit: 2017-06-23 | Discharge: 2017-06-23 | Disposition: A | Discharge status: Home / Self Care | Payer: Medicare Other | Source: Ambulatory Visit | Attending: Radiation Oncology | Admitting: Radiation Oncology

## 2017-06-23 ENCOUNTER — Ambulatory Visit: Payer: Medicare Other

## 2017-06-23 DIAGNOSIS — C44319 Basal cell carcinoma of skin of other parts of face: Secondary | ICD-10-CM | POA: Diagnosis not present

## 2017-06-24 ENCOUNTER — Ambulatory Visit
Admission: RE | Admit: 2017-06-24 | Discharge: 2017-06-24 | Disposition: A | Discharge status: Home / Self Care | Payer: Medicare Other | Source: Ambulatory Visit | Attending: Radiation Oncology | Admitting: Radiation Oncology

## 2017-06-24 DIAGNOSIS — C44319 Basal cell carcinoma of skin of other parts of face: Secondary | ICD-10-CM | POA: Diagnosis not present

## 2017-07-22 ENCOUNTER — Ambulatory Visit
Admission: RE | Admit: 2017-07-22 | Discharge: 2017-07-22 | Disposition: A | Discharge status: Home / Self Care | Payer: Medicare Other | Source: Ambulatory Visit | Attending: Radiation Oncology | Admitting: Radiation Oncology

## 2017-07-22 ENCOUNTER — Encounter: Payer: Self-pay | Admitting: Radiation Oncology

## 2017-07-22 VITALS — BP 123/65 | HR 77 | Resp 20 | Wt 190.9 lb

## 2017-07-22 DIAGNOSIS — Z923 Personal history of irradiation: Secondary | ICD-10-CM | POA: Diagnosis not present

## 2017-07-22 DIAGNOSIS — C44301 Unspecified malignant neoplasm of skin of nose: Secondary | ICD-10-CM | POA: Insufficient documentation

## 2017-07-22 DIAGNOSIS — C44310 Basal cell carcinoma of skin of unspecified parts of face: Secondary | ICD-10-CM

## 2017-07-22 NOTE — Progress Notes (Signed)
Radiation Oncology Follow up Note  Name: Jeffery Avila   Date:   07/22/2017 MRN:  [id number] DOB: Mar 01, 1923    This 83 y.o. male presents to the clinic today for one-month follow-up status post electron beam therapy for skin cancer of multiple areas of the face.  REFERRING PROVIDER: No ref. provider found  HPI: patient is a 83 year old male now out 1 month having completed electron beam radiation therapy to his right ear nose and left cheek. He is seen today in routine follow up is doing well all areas have healed nicely with no evidence of ulceration or persistent disease. He has shown some multiple areas on his back which I've asked him to showed was dermatologist. He has multiple widespread skin lesions throughout his body..  COMPLICATIONS OF TREATMENT: none  FOLLOW UP COMPLIANCE: keeps appointments   PHYSICAL EXAM:  BP 123/65   Pulse 77   Resp 20   Wt 190 lb 14.7 oz (86.6 kg)   BMI 28.19 kg/m  3 areas treated so no evidence of residual disease no evidence of subject gastric cervical or supra clavicular adenopathy is identified. Well-developed well-nourished patient in NAD. HEENT reveals PERLA, EOMI, discs not visualized.  Oral cavity is clear. No oral mucosal lesions are identified. Neck is clear without evidence of cervical or supraclavicular adenopathy. Lungs are clear to A&P. Cardiac examination is essentially unremarkable with regular rate and rhythm without murmur rub or thrill. Abdomen is benign with no organomegaly or masses noted. Motor sensory and DTR levels are equal and symmetric in the upper and lower extremities. Cranial nerves II through XII are grossly intact. Proprioception is intact. No peripheral adenopathy or edema is identified. No motor or sensory levels are noted. Crude visual fields are within normal range.  RADIOLOGY RESULTS: no current films for review  PLAN: present time patient is doing well has had an excellent response to external beam electron beam  energy treatments. I've asked to see him back in 4 months for follow-up. He will see his dermatologist at the [facility] for evaluation of some these other lesions. We happy to reevaluate him at any time. Patient is to call with any concerns.  I would like to take this opportunity to thank you for allowing me to participate in the care of your patient.Armstead Peaks., MD

## 2017-12-13 ENCOUNTER — Ambulatory Visit: Payer: Medicare Other | Attending: Radiation Oncology | Admitting: Radiation Oncology

## 2018-05-30 ENCOUNTER — Encounter: Payer: Self-pay | Admitting: *Deleted

## 2018-06-07 ENCOUNTER — Ambulatory Visit: Payer: Medicare Other | Admitting: Anesthesiology

## 2018-06-07 ENCOUNTER — Encounter
Admission: RE | Disposition: A | Discharge status: Home / Self Care | Payer: Self-pay | Source: Ambulatory Visit | Attending: Ophthalmology

## 2018-06-07 ENCOUNTER — Encounter: Payer: Self-pay | Admitting: Ophthalmology

## 2018-06-07 ENCOUNTER — Other Ambulatory Visit: Payer: Self-pay

## 2018-06-07 ENCOUNTER — Ambulatory Visit
Admission: RE | Admit: 2018-06-07 | Discharge: 2018-06-07 | Disposition: A | Discharge status: Home / Self Care | Payer: Medicare Other | Source: Ambulatory Visit | Attending: Ophthalmology | Admitting: Ophthalmology

## 2018-06-07 DIAGNOSIS — Z79899 Other long term (current) drug therapy: Secondary | ICD-10-CM | POA: Insufficient documentation

## 2018-06-07 DIAGNOSIS — N400 Enlarged prostate without lower urinary tract symptoms: Secondary | ICD-10-CM | POA: Diagnosis not present

## 2018-06-07 DIAGNOSIS — I10 Essential (primary) hypertension: Secondary | ICD-10-CM | POA: Diagnosis not present

## 2018-06-07 DIAGNOSIS — Z85820 Personal history of malignant melanoma of skin: Secondary | ICD-10-CM | POA: Insufficient documentation

## 2018-06-07 DIAGNOSIS — H2511 Age-related nuclear cataract, right eye: Secondary | ICD-10-CM | POA: Insufficient documentation

## 2018-06-07 DIAGNOSIS — Z87891 Personal history of nicotine dependence: Secondary | ICD-10-CM | POA: Diagnosis not present

## 2018-06-07 DIAGNOSIS — E119 Type 2 diabetes mellitus without complications: Secondary | ICD-10-CM | POA: Diagnosis not present

## 2018-06-07 DIAGNOSIS — K219 Gastro-esophageal reflux disease without esophagitis: Secondary | ICD-10-CM | POA: Diagnosis not present

## 2018-06-07 DIAGNOSIS — Z7984 Long term (current) use of oral hypoglycemic drugs: Secondary | ICD-10-CM | POA: Diagnosis not present

## 2018-06-07 DIAGNOSIS — Z7982 Long term (current) use of aspirin: Secondary | ICD-10-CM | POA: Diagnosis not present

## 2018-06-07 HISTORY — DX: Gastro-esophageal reflux disease without esophagitis: K21.9

## 2018-06-07 HISTORY — DX: Dyspnea, unspecified: R06.00

## 2018-06-07 HISTORY — DX: Unspecified hearing loss, unspecified ear: H91.90

## 2018-06-07 HISTORY — DX: Personal history of other diseases of the digestive system: Z87.19

## 2018-06-07 HISTORY — DX: Benign prostatic hyperplasia without lower urinary tract symptoms: N40.0

## 2018-06-07 HISTORY — PX: CATARACT EXTRACTION W/PHACO: SHX586

## 2018-06-07 HISTORY — DX: Essential (primary) hypertension: I10

## 2018-06-07 LAB — GLUCOSE, CAPILLARY: GLUCOSE-CAPILLARY: 112 mg/dL — AB (ref 70–99)

## 2018-06-07 SURGERY — PHACOEMULSIFICATION, CATARACT, WITH IOL INSERTION
Anesthesia: Monitor Anesthesia Care | Site: Eye | Laterality: Right | Wound class: "Clean "

## 2018-06-07 MED ORDER — MOXIFLOXACIN HCL 0.5 % OP SOLN
OPHTHALMIC | Status: AC
  Filled 2018-06-07: qty 3

## 2018-06-07 MED ORDER — MIDAZOLAM HCL 2 MG/2ML IJ SOLN
INTRAMUSCULAR | Status: AC
  Filled 2018-06-07: qty 2

## 2018-06-07 MED ORDER — POVIDONE-IODINE 5 % OP SOLN
OPHTHALMIC | Status: AC
  Filled 2018-06-07: qty 30

## 2018-06-07 MED ORDER — MIDAZOLAM HCL 2 MG/2ML IJ SOLN
INTRAMUSCULAR | Status: DC | PRN
  Administered 2018-06-07 (×2): 1 mg via INTRAVENOUS

## 2018-06-07 MED ORDER — POVIDONE-IODINE 5 % OP SOLN
OPHTHALMIC | Status: DC | PRN
  Administered 2018-06-07: 1 via OPHTHALMIC

## 2018-06-07 MED ORDER — EPINEPHRINE PF 1 MG/ML IJ SOLN
INTRAMUSCULAR | Status: DC | PRN
  Administered 2018-06-07: 08:00:00 via OPHTHALMIC

## 2018-06-07 MED ORDER — CARBACHOL 0.01 % IO SOLN
INTRAOCULAR | Status: DC | PRN
  Administered 2018-06-07: 0.5 mL via INTRAOCULAR

## 2018-06-07 MED ORDER — EPINEPHRINE PF 1 MG/ML IJ SOLN
INTRAMUSCULAR | Status: AC
  Filled 2018-06-07: qty 2

## 2018-06-07 MED ORDER — NA CHONDROIT SULF-NA HYALURON 40-17 MG/ML IO SOLN
INTRAOCULAR | Status: AC
  Filled 2018-06-07: qty 1

## 2018-06-07 MED ORDER — MOXIFLOXACIN HCL 0.5 % OP SOLN: 1.0000 [drp] | OPHTHALMIC | Status: DC | PRN

## 2018-06-07 MED ORDER — ARMC OPHTHALMIC DILATING DROPS
OPHTHALMIC | Status: AC
  Administered 2018-06-07: 1 via OPHTHALMIC
  Filled 2018-06-07: qty 0.5

## 2018-06-07 MED ORDER — LIDOCAINE HCL (PF) 4 % IJ SOLN
INTRAMUSCULAR | Status: AC
  Filled 2018-06-07: qty 5

## 2018-06-07 MED ORDER — LIDOCAINE HCL (PF) 4 % IJ SOLN
INTRAOCULAR | Status: DC | PRN
  Administered 2018-06-07: 4 mL via OPHTHALMIC

## 2018-06-07 MED ORDER — MOXIFLOXACIN HCL 0.5 % OP SOLN
OPHTHALMIC | Status: DC | PRN
  Administered 2018-06-07: 0.2 mL via OPHTHALMIC

## 2018-06-07 MED ORDER — NA CHONDROIT SULF-NA HYALURON 40-17 MG/ML IO SOLN
INTRAOCULAR | Status: DC | PRN
  Administered 2018-06-07: 1 mL via INTRAOCULAR

## 2018-06-07 MED ORDER — SODIUM CHLORIDE 0.9 % IV SOLN
INTRAVENOUS | Status: DC
  Administered 2018-06-07 (×2): via INTRAVENOUS

## 2018-06-07 MED ORDER — ARMC OPHTHALMIC DILATING DROPS
1.0000 "application " | OPHTHALMIC | Status: AC
  Administered 2018-06-07 (×3): 1 via OPHTHALMIC

## 2018-06-07 SURGICAL SUPPLY — 17 items
GLOVE BIO SURGEON STRL SZ8 (GLOVE) ×3 IMPLANT
GLOVE BIOGEL M 6.5 STRL (GLOVE) ×3 IMPLANT
GLOVE SURG LX 8.0 MICRO (GLOVE) ×2
GLOVE SURG LX STRL 8.0 MICRO (GLOVE) ×1 IMPLANT
GOWN STRL REUS W/ TWL LRG LVL3 (GOWN DISPOSABLE) ×2 IMPLANT
GOWN STRL REUS W/TWL LRG LVL3 (GOWN DISPOSABLE) ×4
LABEL CATARACT MEDS ST (LABEL) ×3 IMPLANT
LENS IOL TECNIS ITEC 20.0 (Intraocular Lens) ×2 IMPLANT
PACK CATARACT (MISCELLANEOUS) ×3 IMPLANT
PACK CATARACT BRASINGTON LX (MISCELLANEOUS) ×3 IMPLANT
PACK EYE AFTER SURG (MISCELLANEOUS) ×3 IMPLANT
SOL BSS BAG (MISCELLANEOUS) ×3
SOLUTION BSS BAG (MISCELLANEOUS) ×1 IMPLANT
SUT ETHILON 10 0 CS140 6 (SUTURE) ×2 IMPLANT
SYR 5ML LL (SYRINGE) ×3 IMPLANT
WATER STERILE IRR 250ML POUR (IV SOLUTION) ×3 IMPLANT
WIPE NON LINTING 3.25X3.25 (MISCELLANEOUS) ×3 IMPLANT

## 2018-06-07 NOTE — Anesthesia Preprocedure Evaluation (Signed)
Anesthesia Evaluation  Patient identified by MRN, date of birth, ID band Patient awake    Reviewed: Allergy & Precautions, H&P , NPO status , Patient's Chart, lab work & pertinent test results  History of Anesthesia Complications Negative for: history of anesthetic complications  Airway Mallampati: II  TM Distance: >3 FB Neck ROM: full    Dental  (+) Partial Lower, Upper Dentures   Pulmonary neg pulmonary ROS, neg COPD, former smoker,    breath sounds clear to auscultation       Cardiovascular hypertension, (-) angina(-) Past MI and (-) Cardiac Stents negative cardio ROS   Rhythm:regular Rate:Normal     Neuro/Psych negative neurological ROS  negative psych ROS   GI/Hepatic negative GI ROS, Neg liver ROS, hiatal hernia, GERD  ,  Endo/Other  negative endocrine ROS  Renal/GU      Musculoskeletal   Abdominal   Peds  Hematology negative hematology ROS (+)   Anesthesia Other Findings Past Medical History: No date: BPH (benign prostatic hyperplasia) No date: Dyspnea     Comment:  DOE No date: GERD (gastroesophageal reflux disease) No date: History of hiatal hernia No date: HOH (hard of hearing)     Comment:  AIDS No date: Hypertension No date: Skin cancer     Comment:  COLON  Past Surgical History: No date: APPENDECTOMY No date: SKIN CANCER EXCISION  BMI    Body Mass Index:  26.58 kg/m      Reproductive/Obstetrics negative OB ROS                             Anesthesia Physical Anesthesia Plan  ASA: III  Anesthesia Plan: MAC   Post-op Pain Management:    Induction:   PONV Risk Score and Plan:   Airway Management Planned:   Additional Equipment:   Intra-op Plan:   Post-operative Plan:   Informed Consent: I have reviewed the patients History and Physical, chart, labs and discussed the procedure including the risks, benefits and alternatives for the proposed  anesthesia with the patient or authorized representative who has indicated his/her understanding and acceptance.     Plan Discussed with: Anesthesiologist, CRNA and Surgeon  Anesthesia Plan Comments:         Anesthesia Quick Evaluation

## 2018-06-07 NOTE — Anesthesia Post-op Follow-up Note (Signed)
Anesthesia QCDR form completed.        

## 2018-06-07 NOTE — Anesthesia Postprocedure Evaluation (Signed)
Anesthesia Post Note  Patient: Jeffery Avila  Procedure(s) Performed: CATARACT EXTRACTION PHACO AND INTRAOCULAR LENS PLACEMENT ([place]) suture placed in right eye at end of procedure (Right Eye)  Patient location during evaluation: PACU Anesthesia Type: MAC Level of consciousness: awake and alert Pain management: pain level controlled Vital Signs Assessment: post-procedure vital signs reviewed and stable Respiratory status: spontaneous breathing, nonlabored ventilation and respiratory function stable Cardiovascular status: stable and blood pressure returned to baseline Postop Assessment: no apparent nausea or vomiting Anesthetic complications: no     Last Vitals:  Vitals:   06/07/18 0858 06/07/18 0910  BP: (!) 128/46 (!) 128/46  Pulse: (!) 55 (!) 56  Resp: 16   Temp: (!) 35.9 C   SpO2: 96% 98%    Last Pain:  Vitals:   06/07/18 0910  TempSrc:   PainSc: 0-No pain                 Durenda Hurt

## 2018-06-07 NOTE — Op Note (Signed)
PREOPERATIVE DIAGNOSIS:  Nuclear sclerotic cataract of the right eye.   POSTOPERATIVE DIAGNOSIS:  NUCLEAR SCLEROTIC CATARACT RIGHT EYE   OPERATIVE PROCEDURE: Procedure(s): CATARACT EXTRACTION PHACO AND INTRAOCULAR LENS PLACEMENT ([place]) suture placed in right eye at end of procedure   SURGEON:  Birder Robson, MD.   ANESTHESIA:  Anesthesiologist: Durenda Hurt, MD CRNA: Marcy Siren, CRNA  1.      Managed anesthesia care. 2.      0.48ml of Shugarcaine was instilled in the eye following the paracentesis.   COMPLICATIONS: 16-1 nylon suture placed due to very floppy iris.   TECHNIQUE:   Stop and chop   DESCRIPTION OF PROCEDURE:  The patient was examined and consented in the preoperative holding area where the aforementioned topical anesthesia was applied to the right eye and then brought back to the Operating Room where the right eye was prepped and draped in the usual sterile ophthalmic fashion and a lid speculum was placed. A paracentesis was created with the side port blade and the anterior chamber was filled with viscoelastic. A near clear corneal incision was performed with the steel keratome. A continuous curvilinear capsulorrhexis was performed with a cystotome followed by the capsulorrhexis forceps. Hydrodissection and hydrodelineation were carried out with BSS on a blunt cannula. The lens was removed in a stop and chop  technique and the remaining cortical material was removed with the irrigation-aspiration handpiece. The capsular bag was inflated with viscoelastic and the Technis [id number]  lens was placed in the capsular bag without complication. The remaining viscoelastic was removed from the eye with the irrigation-aspiration handpiece. The wounds were hydrated. The anterior chamber was flushed with Miostat and the eye was inflated to physiologic pressure. 0.53ml of Vigamox was placed in the anterior chamber. The wounds were found to be water tight. The eye was dressed with  Vigamox. The patient was given protective glasses to wear throughout the day and a shield with which to sleep tonight. The patient was also given drops with which to begin a drop regimen today and will follow-up with me in one day. Implant Name Type Inv. Item Serial No. Manufacturer Lot No. LRB No. Used  LENS IOL DIOP 20.0 - [id number] Intraocular Lens LENS IOL DIOP 20.0 [id number] AMO  Right 1   Procedure(s) with comments: CATARACT EXTRACTION PHACO AND INTRAOCULAR LENS PLACEMENT (IOC) suture placed in right eye at end of procedure (Right) - [place] 00:56.5 AP% 15.6 CDE 8.81 Fluid Pack Lot # [id number]  Electronically signed: Birder Robson 06/07/2018 8:55 AM

## 2018-06-07 NOTE — Transfer of Care (Addendum)
Immediate Anesthesia Transfer of Care Note  Patient: Jeffery Avila  Procedure(s) Performed: CATARACT EXTRACTION PHACO AND INTRAOCULAR LENS PLACEMENT ([place]) suture placed in right eye at end of procedure (Right Eye)  Patient Location: PACU  Anesthesia Type:MAC  Level of Consciousness: awake  Airway & Oxygen Therapy: Patient Spontanous Breathing  Post-op Assessment: Report given to RN  Post vital signs: stable  Last Vitals:  Vitals Value Taken Time  BP    Temp    Pulse    Resp    SpO2      Last Pain:  Vitals:   06/07/18 0657  TempSrc: Temporal  PainSc: 0-No pain         Complications: No apparent anesthesia complications

## 2018-06-07 NOTE — H&P (Signed)
All labs reviewed. Abnormal studies sent to patients PCP when indicated.  Previous H&P reviewed, patient examined, there are NO CHANGES.  Jeffery Canion Porfilio8/20/20198:14 AM

## 2018-06-07 NOTE — Discharge Instructions (Signed)
Eye Surgery Discharge Instructions    Expect mild scratchy sensation or mild soreness. DO NOT RUB YOUR EYE!  The day of surgery:  Minimal physical activity, but bed rest is not required  No reading, computer work, or close hand work  No bending, lifting, or straining.  May watch TV  For 24 hours:  No driving, legal decisions, or alcoholic beverages  Safety precautions  Eat anything you prefer: It is better to start with liquids, then soup then solid foods.  _____ Eye patch should be worn until postoperative exam tomorrow.  ____ Solar shield eyeglasses should be worn for comfort in the sunlight/patch while sleeping  Resume all regular medications including aspirin or Coumadin if these were discontinued prior to surgery. You may shower, bathe, shave, or wash your hair. Tylenol may be taken for mild discomfort.  Call your doctor if you experience significant pain, nausea, or vomiting, fever > 101 or other signs of infection. [phone] or [phone] Specific instructions:  Follow-up Information    Birder Robson, MD Follow up.   Specialty:  Ophthalmology Why:  August 21 at 10:25am Contact information: [address] [place] [address] [phone]

## 2018-06-10 ENCOUNTER — Ambulatory Visit
Admission: RE | Admit: 2018-06-10 | Discharge: 2018-06-10 | Disposition: A | Discharge status: Home / Self Care | Payer: Medicare Other | Source: Ambulatory Visit | Attending: Radiation Oncology | Admitting: Radiation Oncology

## 2018-06-10 ENCOUNTER — Other Ambulatory Visit: Payer: Self-pay | Admitting: *Deleted

## 2018-06-10 ENCOUNTER — Encounter: Payer: Self-pay | Admitting: *Deleted

## 2018-06-10 DIAGNOSIS — R221 Localized swelling, mass and lump, neck: Secondary | ICD-10-CM | POA: Diagnosis not present

## 2018-06-10 DIAGNOSIS — Z85828 Personal history of other malignant neoplasm of skin: Secondary | ICD-10-CM | POA: Insufficient documentation

## 2018-06-10 DIAGNOSIS — Z923 Personal history of irradiation: Secondary | ICD-10-CM | POA: Insufficient documentation

## 2018-06-10 DIAGNOSIS — C44310 Basal cell carcinoma of skin of unspecified parts of face: Secondary | ICD-10-CM

## 2018-06-10 NOTE — Progress Notes (Signed)
Rooming was not completed on this patient, he was a walk in patient to speak with MD.  He was then added on to the schedule.

## 2018-06-10 NOTE — Progress Notes (Signed)
Radiation Oncology Follow up Note  Name: Jeffery Avila   Date:   06/10/2018 MRN:  [id number] DOB: [birth year]    This 84 y.o. male presents to the clinic today for evaluation of left neck skin cancer in patient previous a treated for multiple skin cancers electron beam therapy in the past..  REFERRING PROVIDER: No ref. provider found  HPI: patient is self-referred today for a mass in his left sub-digastric region which she states is skin cancer which was recently excised. He has been treated in the past with electron beam therapy to his right ear nose and left cheek..he states there is now a mass in his left sub-digastric region he would like me to evaluate it.  COMPLICATIONS OF TREATMENT: none  FOLLOW UP COMPLIANCE: keeps appointments   PHYSICAL EXAM:  There were no vitals taken for this visit. Patient has a firm approximate 1.5 cm mass in left sub-digastric region and an area previously excised for presumed skin cancer. No other evidence of adenopathy in the head and neck region is noted. Patient does have multiple skin lesions consistent with solar damage versus previously treated skin cancer.Well-developed well-nourished patient in NAD. HEENT reveals PERLA, EOMI, discs not visualized.  Oral cavity is clear. No oral mucosal lesions are identified. Neck is clear without evidence of cervical or supraclavicular adenopathy. Lungs are clear to A&P. Cardiac examination is essentially unremarkable with regular rate and rhythm without murmur rub or thrill. Abdomen is benign with no organomegaly or masses noted. Motor sensory and DTR levels are equal and symmetric in the upper and lower extremities. Cranial nerves II through XII are grossly intact. Proprioception is intact. No peripheral adenopathy or edema is identified. No motor or sensory levels are noted. Crude visual fields are within normal range.  RADIOLOGY RESULTS: CT scan of the head and neck region ordered with contrast  PLAN: at this time  like to see a CT scan to evaluate the dimensions of this left neck mass. Also will obtain his pathology report from dermatology for my review. Depending on CT findings may recommend a course of radiation therapy to this lesion using probably photons based on the size and depth of the mass being investigated. Patient Has my treatment plan and recommendations well we'll see him back in follow-up after his CT scans performed.  I would like to take this opportunity to thank you for allowing me to participate in the care of your patient.Noreene Filbert, MD

## 2018-06-23 ENCOUNTER — Encounter: Payer: Self-pay | Admitting: *Deleted

## 2018-06-23 ENCOUNTER — Ambulatory Visit
Admission: RE | Admit: 2018-06-23 | Discharge: 2018-06-23 | Disposition: A | Discharge status: Home / Self Care | Payer: Medicare Other | Source: Ambulatory Visit | Attending: Radiation Oncology | Admitting: Radiation Oncology

## 2018-06-23 DIAGNOSIS — M799 Soft tissue disorder, unspecified: Secondary | ICD-10-CM | POA: Insufficient documentation

## 2018-06-23 DIAGNOSIS — C44310 Basal cell carcinoma of skin of unspecified parts of face: Secondary | ICD-10-CM | POA: Insufficient documentation

## 2018-06-23 DIAGNOSIS — I898 Other specified noninfective disorders of lymphatic vessels and lymph nodes: Secondary | ICD-10-CM | POA: Diagnosis not present

## 2018-06-23 HISTORY — DX: Type 2 diabetes mellitus without complications: E11.9

## 2018-06-23 LAB — POCT I-STAT CREATININE: Creatinine, Ser: 1.1 mg/dL (ref 0.61–1.24)

## 2018-06-23 MED ORDER — IOHEXOL 300 MG/ML  SOLN
75.0000 mL | Freq: Once | INTRAMUSCULAR | Status: AC | PRN
  Administered 2018-06-23: 75 mL via INTRAVENOUS

## 2018-06-27 ENCOUNTER — Other Ambulatory Visit: Payer: Self-pay

## 2018-06-27 ENCOUNTER — Other Ambulatory Visit: Payer: Self-pay | Admitting: *Deleted

## 2018-06-27 ENCOUNTER — Ambulatory Visit
Admission: RE | Admit: 2018-06-27 | Discharge: 2018-06-27 | Disposition: A | Discharge status: Home / Self Care | Payer: Medicare Other | Source: Ambulatory Visit | Attending: Radiation Oncology | Admitting: Radiation Oncology

## 2018-06-27 ENCOUNTER — Encounter: Payer: Self-pay | Admitting: Radiation Oncology

## 2018-06-27 DIAGNOSIS — R221 Localized swelling, mass and lump, neck: Secondary | ICD-10-CM | POA: Insufficient documentation

## 2018-06-27 DIAGNOSIS — C44310 Basal cell carcinoma of skin of unspecified parts of face: Secondary | ICD-10-CM

## 2018-06-27 DIAGNOSIS — Z923 Personal history of irradiation: Secondary | ICD-10-CM | POA: Insufficient documentation

## 2018-06-27 NOTE — Progress Notes (Signed)
Radiation Oncology Follow up Note  Name: Jeffery Avila   Date:   06/27/2018 MRN:  [id number] DOB: [birth year]    This 84 y.o. 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  REFERRING PROVIDER: No ref. provider found  HPI: patient is a 84 year old male previously treated to his right ear nose and left cheek.he presented with a mass in his left neck and I ordered a CT scan demonstrating to dermal associated irregular soft tissue masses along the left face the largest measuring 2.9 cm with spiculated extension into the left parotid space. The other area was left submandibular gland measuring 1.6 cm. He also had small but abnormal lymph nodes at the left level Ia IIa and parotid space nodal stations. They recommended PET CT scan for correlation. He is seen today and doing well.  COMPLICATIONS OF TREATMENT: none  FOLLOW UP COMPLIANCE: keeps appointments   PHYSICAL EXAM:  BP (!) (P) 148/71 (BP Location: Left Arm, Patient Position: Sitting)   Pulse (P) 80   Wt (P) 193 lb 2 oz (87.6 kg)   BMI (P) 28.52 kg/m  Elderly male in NAD. Still firm matted mass in his left sub-digastric region. Numerous facial lesions noted. Well-developed well-nourished patient in NAD. HEENT reveals PERLA, EOMI, discs not visualized.  Oral cavity is clear. No oral mucosal lesions are identified. Neck is clear without evidence of cervical or supraclavicular adenopathy. Lungs are clear to A&P. Cardiac examination is essentially unremarkable with regular rate and rhythm without murmur rub or thrill. Abdomen is benign with no organomegaly or masses noted. Motor sensory and DTR levels are equal and symmetric in the upper and lower extremities. Cranial nerves II through XII are grossly intact. Proprioception is intact. No peripheral adenopathy or edema is identified. No motor or sensory levels are noted. Crude visual fields are within normal  range.  RADIOLOGY RESULTS: CT scans are reviewed and compatible with the above-stated findings  PLAN: at this time I to correlate this exam with a PET CT scan and use that for treatment planning purposes. I have ordered a PET CT scan and CT scans inhalation shortly thereafter. I would plan on delivering 7000 cGy to the large neck mass and positive lymph nodes using IM RT radiation therapy to avoid previously treated fields salivary glands oral cavity and spine. Risks and benefits of treatment including fatigue skin reaction possible xerostomia all were discussed in detail.PET CT scan was ordered follow-up CT simulation was planned. Patient and daughter know to call with any concerns.  I would like to take this opportunity to thank you for allowing me to participate in the care of your patient.Noreene Filbert, MD

## 2018-06-28 ENCOUNTER — Ambulatory Visit: Payer: Medicare Other | Admitting: Anesthesiology

## 2018-06-28 ENCOUNTER — Ambulatory Visit
Admission: RE | Admit: 2018-06-28 | Discharge: 2018-06-28 | Disposition: A | Discharge status: Home / Self Care | Payer: Medicare Other | Source: Ambulatory Visit | Attending: Ophthalmology | Admitting: Ophthalmology

## 2018-06-28 ENCOUNTER — Encounter: Payer: Self-pay | Admitting: Anesthesiology

## 2018-06-28 ENCOUNTER — Encounter
Admission: RE | Disposition: A | Discharge status: Home / Self Care | Payer: Self-pay | Source: Ambulatory Visit | Attending: Ophthalmology

## 2018-06-28 DIAGNOSIS — Z7982 Long term (current) use of aspirin: Secondary | ICD-10-CM | POA: Diagnosis not present

## 2018-06-28 DIAGNOSIS — Z79899 Other long term (current) drug therapy: Secondary | ICD-10-CM | POA: Diagnosis not present

## 2018-06-28 DIAGNOSIS — Z85038 Personal history of other malignant neoplasm of large intestine: Secondary | ICD-10-CM | POA: Diagnosis not present

## 2018-06-28 DIAGNOSIS — Z85820 Personal history of malignant melanoma of skin: Secondary | ICD-10-CM | POA: Diagnosis not present

## 2018-06-28 DIAGNOSIS — H2512 Age-related nuclear cataract, left eye: Secondary | ICD-10-CM | POA: Diagnosis not present

## 2018-06-28 DIAGNOSIS — Z87891 Personal history of nicotine dependence: Secondary | ICD-10-CM | POA: Insufficient documentation

## 2018-06-28 DIAGNOSIS — Z7984 Long term (current) use of oral hypoglycemic drugs: Secondary | ICD-10-CM | POA: Diagnosis not present

## 2018-06-28 DIAGNOSIS — I10 Essential (primary) hypertension: Secondary | ICD-10-CM | POA: Diagnosis not present

## 2018-06-28 DIAGNOSIS — E119 Type 2 diabetes mellitus without complications: Secondary | ICD-10-CM | POA: Diagnosis not present

## 2018-06-28 DIAGNOSIS — K219 Gastro-esophageal reflux disease without esophagitis: Secondary | ICD-10-CM | POA: Insufficient documentation

## 2018-06-28 HISTORY — PX: CATARACT EXTRACTION W/PHACO: SHX586

## 2018-06-28 LAB — GLUCOSE, CAPILLARY: GLUCOSE-CAPILLARY: 162 mg/dL — AB (ref 70–99)

## 2018-06-28 SURGERY — PHACOEMULSIFICATION, CATARACT, WITH IOL INSERTION
Anesthesia: Monitor Anesthesia Care | Site: Eye | Laterality: Left | Wound class: "Clean "

## 2018-06-28 MED ORDER — LIDOCAINE HCL (PF) 4 % IJ SOLN
INTRAMUSCULAR | Status: AC
  Filled 2018-06-28: qty 5

## 2018-06-28 MED ORDER — EPINEPHRINE PF 1 MG/ML IJ SOLN
INTRAMUSCULAR | Status: AC
  Filled 2018-06-28: qty 1

## 2018-06-28 MED ORDER — LIDOCAINE HCL (PF) 4 % IJ SOLN
INTRAOCULAR | Status: DC | PRN
  Administered 2018-06-28: 2 mL via OPHTHALMIC

## 2018-06-28 MED ORDER — FENTANYL CITRATE (PF) 100 MCG/2ML IJ SOLN
INTRAMUSCULAR | Status: AC
  Filled 2018-06-28: qty 2

## 2018-06-28 MED ORDER — ARMC OPHTHALMIC DILATING DROPS
1.0000 "application " | OPHTHALMIC | Status: AC
  Administered 2018-06-28 (×3): 1 via OPHTHALMIC

## 2018-06-28 MED ORDER — FENTANYL CITRATE (PF) 100 MCG/2ML IJ SOLN
INTRAMUSCULAR | Status: DC | PRN
  Administered 2018-06-28: 50 ug via INTRAVENOUS

## 2018-06-28 MED ORDER — MOXIFLOXACIN HCL 0.5 % OP SOLN
OPHTHALMIC | Status: AC
  Filled 2018-06-28: qty 3

## 2018-06-28 MED ORDER — POVIDONE-IODINE 5 % OP SOLN
OPHTHALMIC | Status: AC
  Filled 2018-06-28: qty 30

## 2018-06-28 MED ORDER — CARBACHOL 0.01 % IO SOLN
INTRAOCULAR | Status: DC | PRN
  Administered 2018-06-28: .5 mL via INTRAOCULAR

## 2018-06-28 MED ORDER — ARMC OPHTHALMIC DILATING DROPS
OPHTHALMIC | Status: AC
  Administered 2018-06-28: 1 via OPHTHALMIC
  Filled 2018-06-28: qty 0.5

## 2018-06-28 MED ORDER — NA CHONDROIT SULF-NA HYALURON 40-17 MG/ML IO SOLN
INTRAOCULAR | Status: AC
  Filled 2018-06-28: qty 1

## 2018-06-28 MED ORDER — SODIUM CHLORIDE 0.9 % IV SOLN
INTRAVENOUS | Status: DC
  Administered 2018-06-28: 08:00:00 via INTRAVENOUS

## 2018-06-28 MED ORDER — EPINEPHRINE PF 1 MG/ML IJ SOLN
INTRAOCULAR | Status: DC | PRN
  Administered 2018-06-28: 1 mL via OPHTHALMIC

## 2018-06-28 MED ORDER — NA CHONDROIT SULF-NA HYALURON 40-17 MG/ML IO SOLN
INTRAOCULAR | Status: DC | PRN
  Administered 2018-06-28: 1 mL via INTRAOCULAR

## 2018-06-28 MED ORDER — MOXIFLOXACIN HCL 0.5 % OP SOLN
OPHTHALMIC | Status: DC | PRN
  Administered 2018-06-28: .2 mL via OPHTHALMIC

## 2018-06-28 MED ORDER — MOXIFLOXACIN HCL 0.5 % OP SOLN: 1.0000 [drp] | OPHTHALMIC | Status: DC | PRN

## 2018-06-28 MED ORDER — POVIDONE-IODINE 5 % OP SOLN
OPHTHALMIC | Status: DC | PRN
  Administered 2018-06-28: 1 via OPHTHALMIC

## 2018-06-28 SURGICAL SUPPLY — 18 items
GLOVE BIO SURGEON STRL SZ8 (GLOVE) ×3 IMPLANT
GLOVE BIOGEL M 6.5 STRL (GLOVE) ×3 IMPLANT
GLOVE SURG LX 8.0 MICRO (GLOVE) ×2
GLOVE SURG LX STRL 8.0 MICRO (GLOVE) ×1 IMPLANT
GOWN STRL REUS W/ TWL LRG LVL3 (GOWN DISPOSABLE) ×2 IMPLANT
GOWN STRL REUS W/TWL LRG LVL3 (GOWN DISPOSABLE) ×4
LABEL CATARACT MEDS ST (LABEL) ×3 IMPLANT
LENS IOL TECNIS ITEC 20.5 (Intraocular Lens) ×2 IMPLANT
PACK CATARACT (MISCELLANEOUS) ×3 IMPLANT
PACK CATARACT BRASINGTON LX (MISCELLANEOUS) ×3 IMPLANT
PACK EYE AFTER SURG (MISCELLANEOUS) ×3 IMPLANT
RING MALYGIN (MISCELLANEOUS) ×2 IMPLANT
SOL BSS BAG (MISCELLANEOUS) ×3
SOLUTION BSS BAG (MISCELLANEOUS) ×1 IMPLANT
SYR 5ML LL (SYRINGE) ×3 IMPLANT
WATER STERILE IRR 250ML POUR (IV SOLUTION) ×3 IMPLANT
WIPE NON LINTING 3.25X3.25 (MISCELLANEOUS) ×3 IMPLANT
malyugin ring ×2 IMPLANT

## 2018-06-28 NOTE — Op Note (Signed)
PREOPERATIVE DIAGNOSIS:  Nuclear sclerotic cataract of the left eye.   POSTOPERATIVE DIAGNOSIS:  Nuclear sclerotic cataract of the left eye.   OPERATIVE PROCEDURE: Procedure(s): CATARACT EXTRACTION PHACO AND INTRAOCULAR LENS PLACEMENT (IOC)   SURGEON:  Birder Robson, MD.   ANESTHESIA:  Anesthesiologist: Gunnar Bulla, MD CRNA: Eben Burow, CRNA  1.      Managed anesthesia care. 2.     0.3ml of Shugarcaine was instilled following the paracentesis   COMPLICATIONS: Viscoelastic was used to raise the pupil margin.  A  Malyugin ring was placed as the pupil would not achieve sufficient pharmacologic dilation to undergo cataract extraction safely.( The ring was removed atraumatically following insertion of the IOL.)    TECHNIQUE:   Stop and chop   DESCRIPTION OF PROCEDURE:  The patient was examined and consented in the preoperative holding area where the aforementioned topical anesthesia was applied to the left eye and then brought back to the Operating Room where the left eye was prepped and draped in the usual sterile ophthalmic fashion and a lid speculum was placed. A paracentesis was created with the side port blade and the anterior chamber was filled with viscoelastic. A near clear corneal incision was performed with the steel keratome. A continuous curvilinear capsulorrhexis was performed with a cystotome followed by the capsulorrhexis forceps. Hydrodissection and hydrodelineation were carried out with BSS on a blunt cannula. The lens was removed in a stop and chop  technique and the remaining cortical material was removed with the irrigation-aspiration handpiece. The capsular bag was inflated with viscoelastic and the Technis [id number] lens was placed in the capsular bag without complication. The remaining viscoelastic was removed from the eye with the irrigation-aspiration handpiece. The wounds were hydrated. The anterior chamber was flushed with Miostat and the eye was inflated to  physiologic pressure. 0.5ml Vigamox was placed in the anterior chamber. The wounds were found to be water tight. The eye was dressed with Vigamox. The patient was given protective glasses to wear throughout the day and a shield with which to sleep tonight. The patient was also given drops with which to begin a drop regimen today and will follow-up with me in one day. Implant Name Type Inv. Item Serial No. Manufacturer Lot No. LRB No. Used  LENS IOL DIOP 20.5 - [id number] Intraocular Lens LENS IOL DIOP 20.5 [id number] AMO  Left 1    Procedure(s) with comments: CATARACT EXTRACTION PHACO AND INTRAOCULAR LENS PLACEMENT ([facility]) (Left) - [place]  00:53 AP% 14.4 CDE 7.72 Fluid pack lot # [id number]  Electronically signed: Birder Robson 06/28/2018 8:59 AM

## 2018-06-28 NOTE — Anesthesia Preprocedure Evaluation (Signed)
Anesthesia Evaluation  Patient identified by MRN, date of birth, ID band Patient awake    Reviewed: Allergy & Precautions, NPO status , Patient's Chart, lab work & pertinent test results, reviewed documented beta blocker date and time   Airway Mallampati: II  TM Distance: >3 FB     Dental  (+) Chipped   Pulmonary shortness of breath, former smoker,           Cardiovascular hypertension, Pt. on medications      Neuro/Psych    GI/Hepatic hiatal hernia, GERD  Controlled,  Endo/Other  diabetes, Type 2  Renal/GU      Musculoskeletal   Abdominal   Peds  Hematology   Anesthesia Other Findings   Reproductive/Obstetrics                             Anesthesia Physical Anesthesia Plan  ASA: III  Anesthesia Plan: MAC   Post-op Pain Management:    Induction:   PONV Risk Score and Plan:   Airway Management Planned:   Additional Equipment:   Intra-op Plan:   Post-operative Plan:   Informed Consent: I have reviewed the patients History and Physical, chart, labs and discussed the procedure including the risks, benefits and alternatives for the proposed anesthesia with the patient or authorized representative who has indicated his/her understanding and acceptance.     Plan Discussed with: CRNA  Anesthesia Plan Comments:         Anesthesia Quick Evaluation

## 2018-06-28 NOTE — Transfer of Care (Signed)
Immediate Anesthesia Transfer of Care Note  Patient: Jeffery Avila  Procedure(s) Performed: CATARACT EXTRACTION PHACO AND INTRAOCULAR LENS PLACEMENT ([facility]) (Left Eye)  Patient Location: Short Stay  Anesthesia Type:MAC  Level of Consciousness: awake, alert , oriented and patient cooperative  Airway & Oxygen Therapy: Patient Spontanous Breathing  Post-op Assessment: Report given to RN  Post vital signs: Reviewed and stable  Last Vitals:  Vitals Value Taken Time  BP 127/57 06/28/2018  9:01 AM  Temp 36.1 C 06/28/2018  9:01 AM  Pulse 63 06/28/2018  9:01 AM  Resp 16 06/28/2018  9:01 AM  SpO2 96 % 06/28/2018  9:01 AM    Last Pain:  Vitals:   06/28/18 0901  TempSrc: Temporal  PainSc: 0-No pain         Complications: No apparent anesthesia complications

## 2018-06-28 NOTE — Anesthesia Postprocedure Evaluation (Signed)
Anesthesia Post Note  Patient: Jeffery Avila  Procedure(s) Performed: CATARACT EXTRACTION PHACO AND INTRAOCULAR LENS PLACEMENT ([place]) (Left Eye)  Patient location during evaluation: Short Stay Anesthesia Type: MAC Level of consciousness: awake and alert, oriented and patient cooperative Pain management: satisfactory to patient Vital Signs Assessment: post-procedure vital signs reviewed and stable Respiratory status: spontaneous breathing, respiratory function stable and nonlabored ventilation Cardiovascular status: blood pressure returned to baseline Postop Assessment: no headache and no apparent nausea or vomiting Anesthetic complications: no     Last Vitals:  Vitals:   06/28/18 0744 06/28/18 0901  BP: (!) 142/64 (!) 127/57  Pulse:  63  Resp:  16  Temp:  (!) 36.1 C  SpO2: 97% 96%    Last Pain:  Vitals:   06/28/18 0901  TempSrc: Temporal  PainSc: 0-No pain                 Eben Burow

## 2018-06-28 NOTE — Anesthesia Post-op Follow-up Note (Signed)
Anesthesia QCDR form completed.        

## 2018-06-28 NOTE — H&P (Signed)
All labs reviewed. Abnormal studies sent to patients PCP when indicated.  Previous H&P reviewed, patient examined, there are NO CHANGES.  Jeffery Gal Porfilio9/10/20198:29 AM

## 2018-06-28 NOTE — Discharge Instructions (Addendum)
Eye Surgery Discharge Instructions  Expect mild scratchy sensation or mild soreness. DO NOT RUB YOUR EYE!  The day of surgery:  Minimal physical activity, but bed rest is not required  No reading, computer work, or close hand work  No bending, lifting, or straining.  May watch TV  For 24 hours:  No driving, legal decisions, or alcoholic beverages  Safety precautions  Eat anything you prefer: It is better to start with liquids, then soup then solid foods. *    Solar shield eyeglasses should be worn for comfort in the sunlight/patch while sleeping  Resume all regular medications including aspirin or Coumadin if these were discontinued prior to surgery. You may shower, bathe, shave, or wash your hair. Tylenol may be taken for mild discomfort. FOLLOW DR. PORFILIO'S EYE DROP INSTRUCTION SHEET AS REVIEWED.  Call your doctor if you experience significant pain, nausea, or vomiting, fever > 101 or other signs of infection. [phone] or [phone] Specific instructions:  Follow-up Information    Birder Robson, MD Follow up.   Specialty:  Ophthalmology Why:  06/29/18 @ 10:55 am Contact information: [place] [address] [phone]

## 2018-06-29 ENCOUNTER — Encounter: Payer: Self-pay | Admitting: Ophthalmology

## 2018-07-11 ENCOUNTER — Telehealth: Payer: Self-pay | Admitting: *Deleted

## 2018-07-11 NOTE — Telephone Encounter (Signed)
Per Margreta Journey 07/11/18 staff message to cancel PET scheduled for 07/12/18 @ 9:30 due to Pending auth. PET has been cancelled as requested.  I called and left a message on his son Torre Schaumburg) vmail making him aware that His father's PET scan had been cancelled as of right now and will be R/S once approved.

## 2018-07-12 ENCOUNTER — Encounter
Admission: RE | Admit: 2018-07-12 | Discharge: 2018-07-12 | Disposition: A | Discharge status: Home / Self Care | Payer: Medicare Other | Source: Ambulatory Visit | Attending: Radiation Oncology | Admitting: Radiation Oncology

## 2018-07-12 ENCOUNTER — Ambulatory Visit: Payer: Medicare Other

## 2018-07-12 DIAGNOSIS — C44310 Basal cell carcinoma of skin of unspecified parts of face: Secondary | ICD-10-CM | POA: Insufficient documentation

## 2018-07-12 LAB — GLUCOSE, CAPILLARY: Glucose-Capillary: 145 mg/dL — ABNORMAL HIGH (ref 70–99)

## 2018-07-12 MED ORDER — FLUDEOXYGLUCOSE F - 18 (FDG) INJECTION
9.5000 | Freq: Once | INTRAVENOUS | Status: AC | PRN
  Administered 2018-07-12: 9.5 via INTRAVENOUS

## 2018-07-18 ENCOUNTER — Ambulatory Visit
Admission: RE | Admit: 2018-07-18 | Discharge: 2018-07-18 | Disposition: A | Discharge status: Home / Self Care | Payer: Medicare Other | Source: Ambulatory Visit | Attending: Radiation Oncology | Admitting: Radiation Oncology

## 2018-07-18 DIAGNOSIS — R221 Localized swelling, mass and lump, neck: Secondary | ICD-10-CM | POA: Diagnosis not present

## 2018-07-18 DIAGNOSIS — Z85828 Personal history of other malignant neoplasm of skin: Secondary | ICD-10-CM | POA: Insufficient documentation

## 2018-07-18 DIAGNOSIS — Z923 Personal history of irradiation: Secondary | ICD-10-CM | POA: Insufficient documentation

## 2018-07-21 ENCOUNTER — Ambulatory Visit: Payer: Medicare Other

## 2018-07-21 DIAGNOSIS — R221 Localized swelling, mass and lump, neck: Secondary | ICD-10-CM | POA: Insufficient documentation

## 2018-07-21 DIAGNOSIS — Z85828 Personal history of other malignant neoplasm of skin: Secondary | ICD-10-CM | POA: Insufficient documentation

## 2018-07-21 DIAGNOSIS — Z923 Personal history of irradiation: Secondary | ICD-10-CM | POA: Diagnosis not present

## 2018-07-26 ENCOUNTER — Ambulatory Visit
Admission: RE | Admit: 2018-07-26 | Discharge: 2018-07-26 | Disposition: A | Discharge status: Home / Self Care | Payer: Medicare Other | Source: Ambulatory Visit | Attending: Radiation Oncology | Admitting: Radiation Oncology

## 2018-07-27 ENCOUNTER — Ambulatory Visit
Admission: RE | Admit: 2018-07-27 | Discharge: 2018-07-27 | Disposition: A | Discharge status: Home / Self Care | Payer: Medicare Other | Source: Ambulatory Visit | Attending: Radiation Oncology | Admitting: Radiation Oncology

## 2018-07-27 DIAGNOSIS — R221 Localized swelling, mass and lump, neck: Secondary | ICD-10-CM | POA: Diagnosis not present

## 2018-07-28 ENCOUNTER — Ambulatory Visit
Admission: RE | Admit: 2018-07-28 | Discharge: 2018-07-28 | Disposition: A | Discharge status: Home / Self Care | Payer: Medicare Other | Source: Ambulatory Visit | Attending: Radiation Oncology | Admitting: Radiation Oncology

## 2018-07-28 DIAGNOSIS — R221 Localized swelling, mass and lump, neck: Secondary | ICD-10-CM | POA: Diagnosis not present

## 2018-07-29 ENCOUNTER — Ambulatory Visit
Admission: RE | Admit: 2018-07-29 | Discharge: 2018-07-29 | Disposition: A | Discharge status: Home / Self Care | Payer: Medicare Other | Source: Ambulatory Visit | Attending: Radiation Oncology | Admitting: Radiation Oncology

## 2018-07-29 DIAGNOSIS — R221 Localized swelling, mass and lump, neck: Secondary | ICD-10-CM | POA: Diagnosis not present

## 2018-08-01 ENCOUNTER — Ambulatory Visit
Admission: RE | Admit: 2018-08-01 | Discharge: 2018-08-01 | Disposition: A | Discharge status: Home / Self Care | Payer: Medicare Other | Source: Ambulatory Visit | Attending: Radiation Oncology | Admitting: Radiation Oncology

## 2018-08-01 DIAGNOSIS — R221 Localized swelling, mass and lump, neck: Secondary | ICD-10-CM | POA: Diagnosis not present

## 2018-08-02 ENCOUNTER — Ambulatory Visit
Admission: RE | Admit: 2018-08-02 | Discharge: 2018-08-02 | Disposition: A | Discharge status: Home / Self Care | Payer: Medicare Other | Source: Ambulatory Visit | Attending: Radiation Oncology | Admitting: Radiation Oncology

## 2018-08-02 DIAGNOSIS — R221 Localized swelling, mass and lump, neck: Secondary | ICD-10-CM | POA: Diagnosis not present

## 2018-08-03 ENCOUNTER — Ambulatory Visit
Admission: RE | Admit: 2018-08-03 | Discharge: 2018-08-03 | Disposition: A | Discharge status: Home / Self Care | Payer: Medicare Other | Source: Ambulatory Visit | Attending: Radiation Oncology | Admitting: Radiation Oncology

## 2018-08-03 ENCOUNTER — Ambulatory Visit: Payer: Medicare Other

## 2018-08-03 DIAGNOSIS — R221 Localized swelling, mass and lump, neck: Secondary | ICD-10-CM | POA: Diagnosis not present

## 2018-08-04 ENCOUNTER — Ambulatory Visit
Admission: RE | Admit: 2018-08-04 | Discharge: 2018-08-04 | Disposition: A | Discharge status: Home / Self Care | Payer: Medicare Other | Source: Ambulatory Visit | Attending: Radiation Oncology | Admitting: Radiation Oncology

## 2018-08-04 DIAGNOSIS — R221 Localized swelling, mass and lump, neck: Secondary | ICD-10-CM | POA: Diagnosis not present

## 2018-08-05 ENCOUNTER — Ambulatory Visit
Admission: RE | Admit: 2018-08-05 | Discharge: 2018-08-05 | Disposition: A | Discharge status: Home / Self Care | Payer: Medicare Other | Source: Ambulatory Visit | Attending: Radiation Oncology | Admitting: Radiation Oncology

## 2018-08-05 DIAGNOSIS — R221 Localized swelling, mass and lump, neck: Secondary | ICD-10-CM | POA: Diagnosis not present

## 2018-08-08 ENCOUNTER — Ambulatory Visit
Admission: RE | Admit: 2018-08-08 | Discharge: 2018-08-08 | Disposition: A | Discharge status: Home / Self Care | Payer: Medicare Other | Source: Ambulatory Visit | Attending: Radiation Oncology | Admitting: Radiation Oncology

## 2018-08-08 DIAGNOSIS — R221 Localized swelling, mass and lump, neck: Secondary | ICD-10-CM | POA: Diagnosis not present

## 2018-08-09 ENCOUNTER — Ambulatory Visit
Admission: RE | Admit: 2018-08-09 | Discharge: 2018-08-09 | Disposition: A | Discharge status: Home / Self Care | Payer: Medicare Other | Source: Ambulatory Visit | Attending: Radiation Oncology | Admitting: Radiation Oncology

## 2018-08-09 DIAGNOSIS — R221 Localized swelling, mass and lump, neck: Secondary | ICD-10-CM | POA: Diagnosis not present

## 2018-08-10 ENCOUNTER — Ambulatory Visit
Admission: RE | Admit: 2018-08-10 | Discharge: 2018-08-10 | Disposition: A | Discharge status: Home / Self Care | Payer: Medicare Other | Source: Ambulatory Visit | Attending: Radiation Oncology | Admitting: Radiation Oncology

## 2018-08-10 DIAGNOSIS — R221 Localized swelling, mass and lump, neck: Secondary | ICD-10-CM | POA: Diagnosis not present

## 2018-08-11 ENCOUNTER — Ambulatory Visit
Admission: RE | Admit: 2018-08-11 | Discharge: 2018-08-11 | Disposition: A | Discharge status: Home / Self Care | Payer: Medicare Other | Source: Ambulatory Visit | Attending: Radiation Oncology | Admitting: Radiation Oncology

## 2018-08-11 DIAGNOSIS — R221 Localized swelling, mass and lump, neck: Secondary | ICD-10-CM | POA: Diagnosis not present

## 2018-08-12 ENCOUNTER — Ambulatory Visit
Admission: RE | Admit: 2018-08-12 | Discharge: 2018-08-12 | Disposition: A | Discharge status: Home / Self Care | Payer: Medicare Other | Source: Ambulatory Visit | Attending: Radiation Oncology | Admitting: Radiation Oncology

## 2018-08-12 DIAGNOSIS — R221 Localized swelling, mass and lump, neck: Secondary | ICD-10-CM | POA: Diagnosis not present

## 2018-08-15 ENCOUNTER — Ambulatory Visit
Admission: RE | Admit: 2018-08-15 | Discharge: 2018-08-15 | Disposition: A | Discharge status: Home / Self Care | Payer: Medicare Other | Source: Ambulatory Visit | Attending: Radiation Oncology | Admitting: Radiation Oncology

## 2018-08-15 DIAGNOSIS — R221 Localized swelling, mass and lump, neck: Secondary | ICD-10-CM | POA: Diagnosis not present

## 2018-08-16 ENCOUNTER — Ambulatory Visit
Admission: RE | Admit: 2018-08-16 | Discharge: 2018-08-16 | Disposition: A | Discharge status: Home / Self Care | Payer: Medicare Other | Source: Ambulatory Visit | Attending: Radiation Oncology | Admitting: Radiation Oncology

## 2018-08-16 DIAGNOSIS — R221 Localized swelling, mass and lump, neck: Secondary | ICD-10-CM | POA: Diagnosis not present

## 2018-08-17 ENCOUNTER — Ambulatory Visit
Admission: RE | Admit: 2018-08-17 | Discharge: 2018-08-17 | Disposition: A | Discharge status: Home / Self Care | Payer: Medicare Other | Source: Ambulatory Visit | Attending: Radiation Oncology | Admitting: Radiation Oncology

## 2018-08-17 DIAGNOSIS — R221 Localized swelling, mass and lump, neck: Secondary | ICD-10-CM | POA: Diagnosis not present

## 2018-08-18 ENCOUNTER — Ambulatory Visit
Admission: RE | Admit: 2018-08-18 | Discharge: 2018-08-18 | Disposition: A | Discharge status: Home / Self Care | Payer: Medicare Other | Source: Ambulatory Visit | Attending: Radiation Oncology | Admitting: Radiation Oncology

## 2018-08-18 DIAGNOSIS — R221 Localized swelling, mass and lump, neck: Secondary | ICD-10-CM | POA: Diagnosis not present

## 2018-08-19 ENCOUNTER — Ambulatory Visit
Admission: RE | Admit: 2018-08-19 | Discharge: 2018-08-19 | Disposition: A | Discharge status: Home / Self Care | Payer: Medicare Other | Source: Ambulatory Visit | Attending: Radiation Oncology | Admitting: Radiation Oncology

## 2018-08-19 DIAGNOSIS — Z85828 Personal history of other malignant neoplasm of skin: Secondary | ICD-10-CM | POA: Diagnosis not present

## 2018-08-19 DIAGNOSIS — R221 Localized swelling, mass and lump, neck: Secondary | ICD-10-CM | POA: Insufficient documentation

## 2018-08-19 DIAGNOSIS — Z923 Personal history of irradiation: Secondary | ICD-10-CM | POA: Insufficient documentation

## 2018-08-22 ENCOUNTER — Ambulatory Visit
Admission: RE | Admit: 2018-08-22 | Discharge: 2018-08-22 | Disposition: A | Discharge status: Home / Self Care | Payer: Medicare Other | Source: Ambulatory Visit | Attending: Radiation Oncology | Admitting: Radiation Oncology

## 2018-08-22 DIAGNOSIS — R221 Localized swelling, mass and lump, neck: Secondary | ICD-10-CM | POA: Diagnosis not present

## 2018-08-23 ENCOUNTER — Ambulatory Visit
Admission: RE | Admit: 2018-08-23 | Discharge: 2018-08-23 | Disposition: A | Discharge status: Home / Self Care | Payer: Medicare Other | Source: Ambulatory Visit | Attending: Radiation Oncology | Admitting: Radiation Oncology

## 2018-08-23 DIAGNOSIS — R221 Localized swelling, mass and lump, neck: Secondary | ICD-10-CM | POA: Diagnosis not present

## 2018-08-24 ENCOUNTER — Ambulatory Visit
Admission: RE | Admit: 2018-08-24 | Discharge: 2018-08-24 | Disposition: A | Discharge status: Home / Self Care | Payer: Medicare Other | Source: Ambulatory Visit | Attending: Radiation Oncology | Admitting: Radiation Oncology

## 2018-08-24 DIAGNOSIS — R221 Localized swelling, mass and lump, neck: Secondary | ICD-10-CM | POA: Diagnosis not present

## 2018-08-25 ENCOUNTER — Ambulatory Visit
Admission: RE | Admit: 2018-08-25 | Discharge: 2018-08-25 | Disposition: A | Discharge status: Home / Self Care | Payer: Medicare Other | Source: Ambulatory Visit | Attending: Radiation Oncology | Admitting: Radiation Oncology

## 2018-08-25 DIAGNOSIS — R221 Localized swelling, mass and lump, neck: Secondary | ICD-10-CM | POA: Diagnosis not present

## 2018-08-26 ENCOUNTER — Ambulatory Visit
Admission: RE | Admit: 2018-08-26 | Discharge: 2018-08-26 | Disposition: A | Discharge status: Home / Self Care | Payer: Medicare Other | Source: Ambulatory Visit | Attending: Radiation Oncology | Admitting: Radiation Oncology

## 2018-08-26 DIAGNOSIS — R221 Localized swelling, mass and lump, neck: Secondary | ICD-10-CM | POA: Diagnosis not present

## 2018-08-28 ENCOUNTER — Ambulatory Visit: Payer: Medicare Other

## 2018-08-29 ENCOUNTER — Ambulatory Visit
Admission: RE | Admit: 2018-08-29 | Discharge: 2018-08-29 | Disposition: A | Discharge status: Home / Self Care | Payer: Medicare Other | Source: Ambulatory Visit | Attending: Radiation Oncology | Admitting: Radiation Oncology

## 2018-08-29 DIAGNOSIS — R221 Localized swelling, mass and lump, neck: Secondary | ICD-10-CM | POA: Diagnosis not present

## 2018-08-30 ENCOUNTER — Ambulatory Visit
Admission: RE | Admit: 2018-08-30 | Discharge: 2018-08-30 | Disposition: A | Discharge status: Home / Self Care | Payer: Medicare Other | Source: Ambulatory Visit | Attending: Radiation Oncology | Admitting: Radiation Oncology

## 2018-08-30 DIAGNOSIS — R221 Localized swelling, mass and lump, neck: Secondary | ICD-10-CM | POA: Diagnosis not present

## 2018-08-31 ENCOUNTER — Ambulatory Visit
Admission: RE | Admit: 2018-08-31 | Discharge: 2018-08-31 | Disposition: A | Discharge status: Home / Self Care | Payer: Medicare Other | Source: Ambulatory Visit | Attending: Radiation Oncology | Admitting: Radiation Oncology

## 2018-08-31 DIAGNOSIS — R221 Localized swelling, mass and lump, neck: Secondary | ICD-10-CM | POA: Diagnosis not present

## 2018-09-01 ENCOUNTER — Ambulatory Visit
Admission: RE | Admit: 2018-09-01 | Discharge: 2018-09-01 | Disposition: A | Discharge status: Home / Self Care | Payer: Medicare Other | Source: Ambulatory Visit | Attending: Radiation Oncology | Admitting: Radiation Oncology

## 2018-09-01 DIAGNOSIS — R221 Localized swelling, mass and lump, neck: Secondary | ICD-10-CM | POA: Diagnosis not present

## 2018-09-02 ENCOUNTER — Ambulatory Visit: Payer: Medicare Other

## 2018-09-02 ENCOUNTER — Ambulatory Visit
Admission: RE | Admit: 2018-09-02 | Discharge: 2018-09-02 | Disposition: A | Discharge status: Home / Self Care | Payer: Medicare Other | Source: Ambulatory Visit | Attending: Radiation Oncology | Admitting: Radiation Oncology

## 2018-09-02 DIAGNOSIS — R221 Localized swelling, mass and lump, neck: Secondary | ICD-10-CM | POA: Diagnosis not present

## 2018-09-05 ENCOUNTER — Ambulatory Visit
Admission: RE | Admit: 2018-09-05 | Discharge: 2018-09-05 | Disposition: A | Discharge status: Home / Self Care | Payer: Medicare Other | Source: Ambulatory Visit | Attending: Radiation Oncology | Admitting: Radiation Oncology

## 2018-09-05 DIAGNOSIS — R221 Localized swelling, mass and lump, neck: Secondary | ICD-10-CM | POA: Diagnosis not present

## 2018-09-06 ENCOUNTER — Ambulatory Visit
Admission: RE | Admit: 2018-09-06 | Discharge: 2018-09-06 | Disposition: A | Discharge status: Home / Self Care | Payer: Medicare Other | Source: Ambulatory Visit | Attending: Radiation Oncology | Admitting: Radiation Oncology

## 2018-09-06 DIAGNOSIS — R221 Localized swelling, mass and lump, neck: Secondary | ICD-10-CM | POA: Diagnosis not present

## 2018-09-07 ENCOUNTER — Ambulatory Visit
Admission: RE | Admit: 2018-09-07 | Discharge: 2018-09-07 | Disposition: A | Discharge status: Home / Self Care | Payer: Medicare Other | Source: Ambulatory Visit | Attending: Radiation Oncology | Admitting: Radiation Oncology

## 2018-09-07 DIAGNOSIS — R221 Localized swelling, mass and lump, neck: Secondary | ICD-10-CM | POA: Diagnosis not present

## 2018-09-08 ENCOUNTER — Ambulatory Visit
Admission: RE | Admit: 2018-09-08 | Discharge: 2018-09-08 | Disposition: A | Discharge status: Home / Self Care | Payer: Medicare Other | Source: Ambulatory Visit | Attending: Radiation Oncology | Admitting: Radiation Oncology

## 2018-09-08 DIAGNOSIS — R221 Localized swelling, mass and lump, neck: Secondary | ICD-10-CM | POA: Diagnosis not present

## 2018-09-09 ENCOUNTER — Ambulatory Visit
Admission: RE | Admit: 2018-09-09 | Discharge: 2018-09-09 | Disposition: A | Discharge status: Home / Self Care | Payer: Medicare Other | Source: Ambulatory Visit | Attending: Radiation Oncology | Admitting: Radiation Oncology

## 2018-09-09 DIAGNOSIS — R221 Localized swelling, mass and lump, neck: Secondary | ICD-10-CM | POA: Diagnosis not present

## 2018-09-12 ENCOUNTER — Ambulatory Visit
Admission: RE | Admit: 2018-09-12 | Discharge: 2018-09-12 | Disposition: A | Discharge status: Home / Self Care | Payer: Medicare Other | Source: Ambulatory Visit | Attending: Radiation Oncology | Admitting: Radiation Oncology

## 2018-09-12 DIAGNOSIS — R221 Localized swelling, mass and lump, neck: Secondary | ICD-10-CM | POA: Diagnosis not present

## 2018-09-13 ENCOUNTER — Ambulatory Visit
Admission: RE | Admit: 2018-09-13 | Discharge: 2018-09-13 | Disposition: A | Discharge status: Home / Self Care | Payer: Medicare Other | Source: Ambulatory Visit | Attending: Radiation Oncology | Admitting: Radiation Oncology

## 2018-09-13 ENCOUNTER — Ambulatory Visit: Payer: Medicare Other

## 2018-09-13 DIAGNOSIS — R221 Localized swelling, mass and lump, neck: Secondary | ICD-10-CM | POA: Diagnosis not present

## 2018-09-14 ENCOUNTER — Ambulatory Visit: Payer: Medicare Other

## 2018-10-26 ENCOUNTER — Ambulatory Visit: Payer: Medicare Other | Attending: Radiation Oncology | Admitting: Radiation Oncology

## 2019-01-20 IMAGING — CT CT NECK W/ CM
4 of 6 series · 13 of 33 positions shown, 15 images · IV contrast (omnipaque)
Comparison: None.

CLINICAL DATA: [AGE] male with skin cancer, basal cell
carcinoma located in the left sub- digastric region.

EXAM:
CT NECK WITH CONTRAST
TECHNIQUE: Multidetector CT imaging of the neck was performed using the
standard protocol following the bolus administration of intravenous
contrast.
CONTRAST:  75mL OMNIPAQUE IOHEXOL 300 MG/ML  SOLN

[Series 2: axial neck · axial · 0.58mm/px · z∈[-324,-238]mm · 2 of 129 slices shown]
[im 43/129  bone]
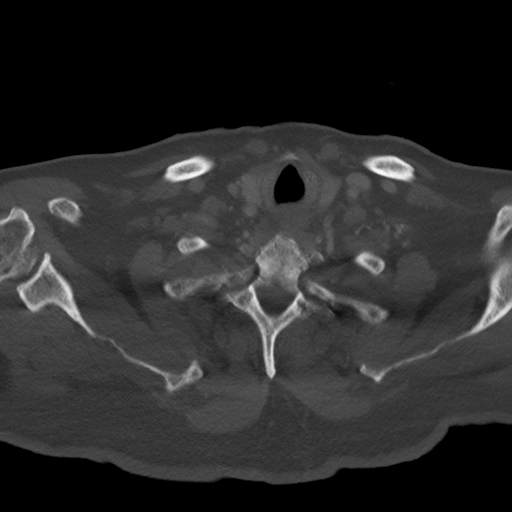
[im 86/129  bone]
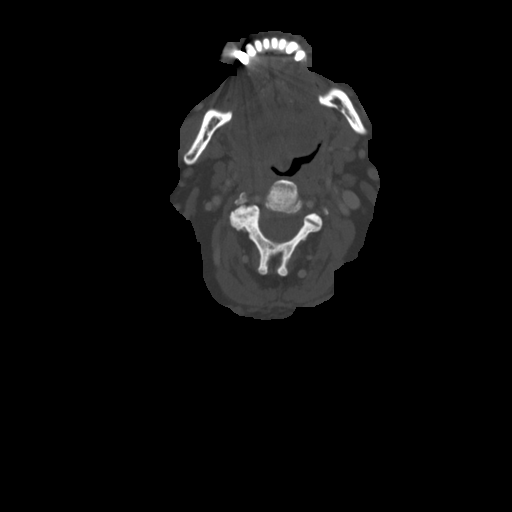

[Series 6: sag neck · sagittal · 0.52mm/px · 5 of 135 slices shown, 6 images]
[im 45/135  bone]
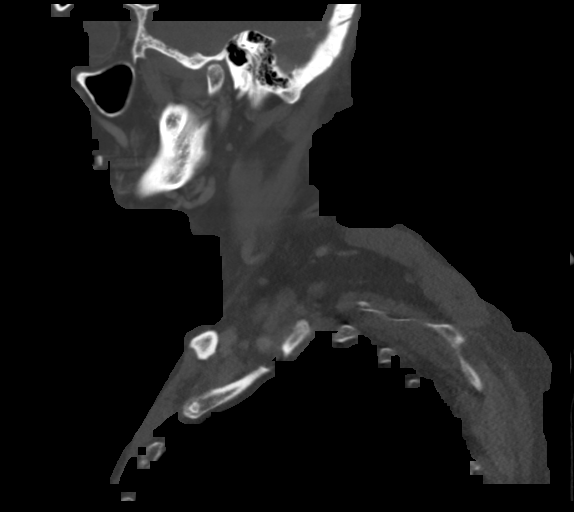
[im 56/135  bone]
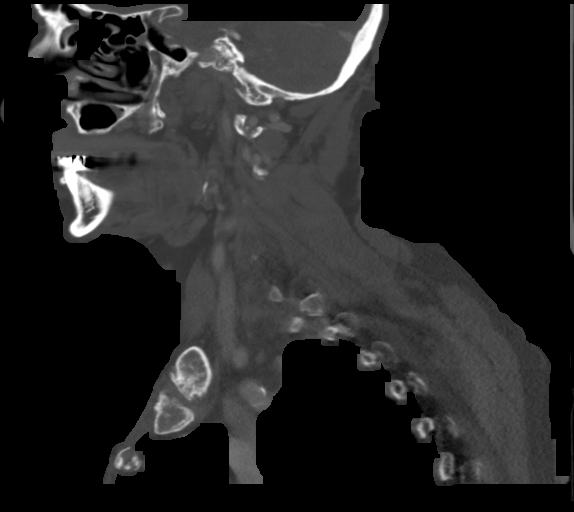
[im 68/135  soft-tissue]
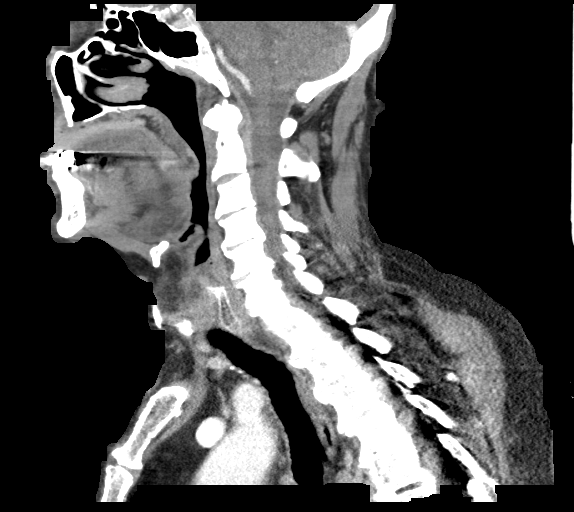
[im 68/135  bone]
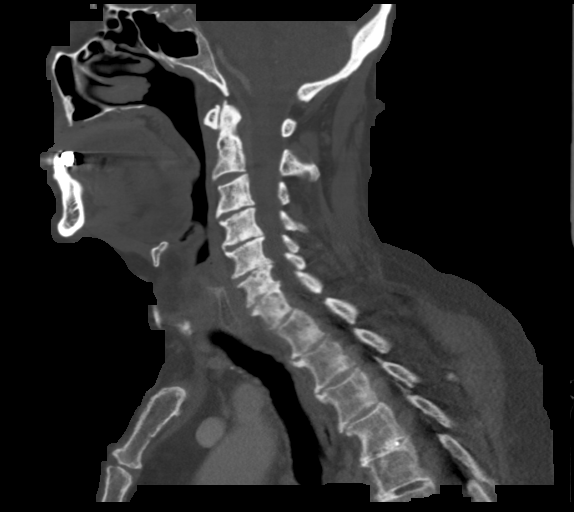
[im 79/135  bone]
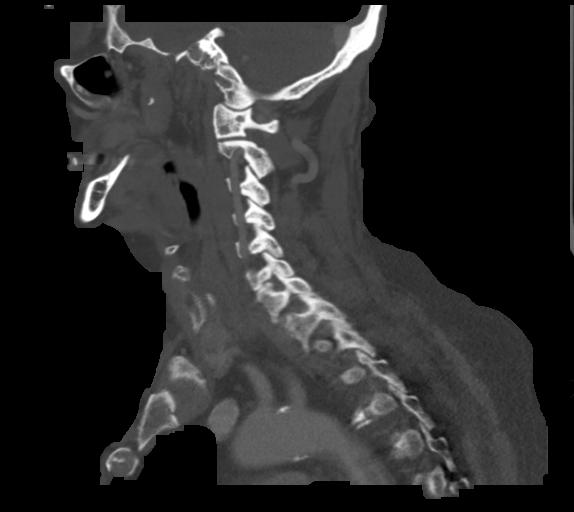
[im 90/135  bone]
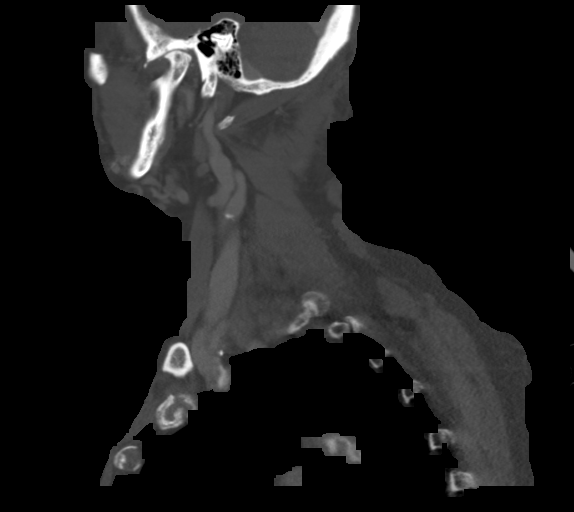

[Series 7: cor neck · coronal · 0.57mm/px · 3 of 140 slices shown]
[im 28/140  bone]
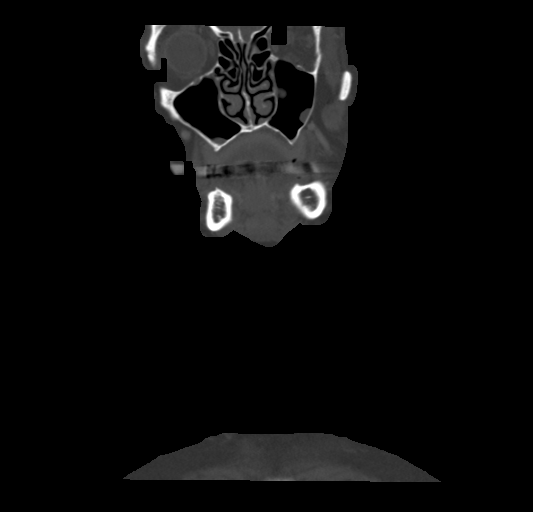
[im 56/140  bone]
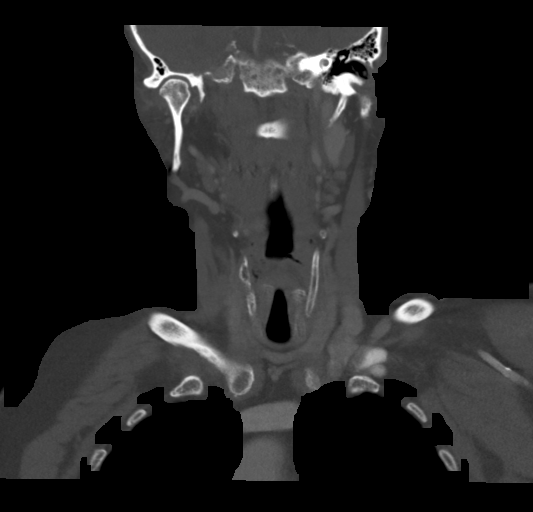
[im 84/140  bone]
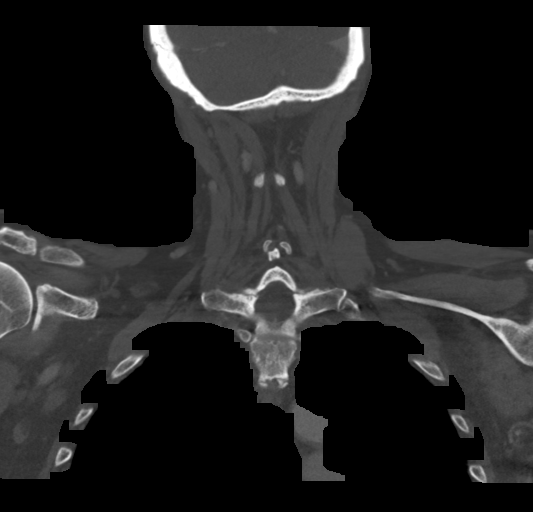

[Series 8: orthogonal ax · axial · 0.56mm/px · z∈[-406,-249]mm · 3 of 162 slices shown, 4 images]
[im 41/162  soft-tissue]
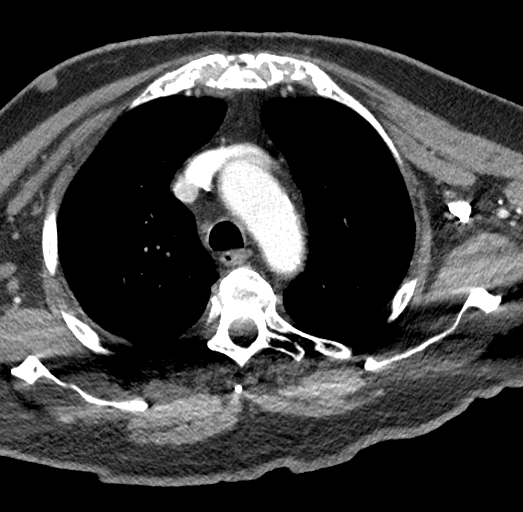
[im 41/162  bone]
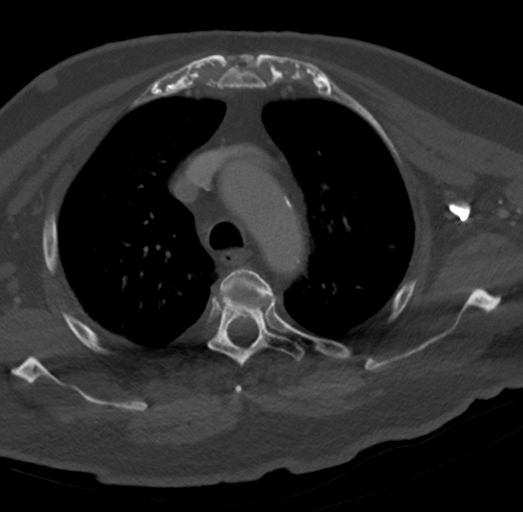
[im 81/162  bone]
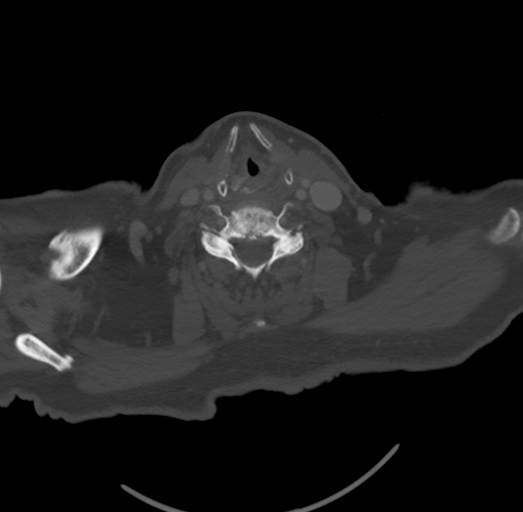
[im 121/162  bone]
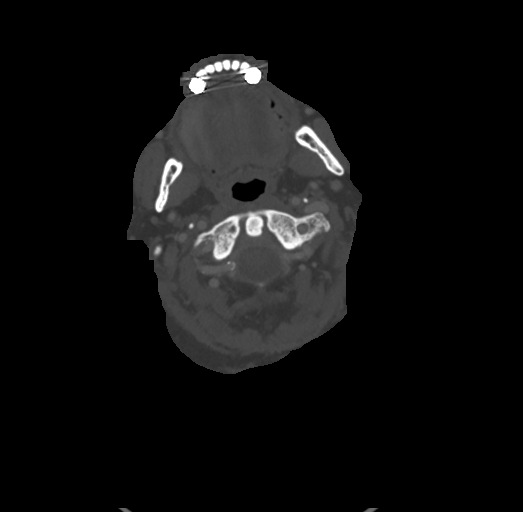

[13 of 33 positions shown; findings below may reference images not displayed]

FINDINGS: Pharynx and larynx: Laryngeal and pharyngeal soft tissue contours
are mildly asymmetric, but with no hyperenhancing or masslike
lesion. Trace retained secretions in the hypopharynx. Negative
parapharyngeal and retropharyngeal spaces.

Salivary glands: The sublingual space and submandibular glands are
within normal limits.

However, immediately lateral to the left submandibular gland seen on
series 2, image 54 and sagittal image 92 there is a lobulated
intermediate density soft tissue mass encompassing 16 x 10 x 14
millimeters (AP by transverse by CC) which is inseparable from the
skin surface and suspicious for a skin carcinoma. Some of this soft
tissue nodularity extends deep to the platysma, but remains separate
from the lateral margin of the left submandibular gland.

Short distance cephalad from that lesion occupying a portion of the
left parotid space there is a similar but larger nodular and
spiculated intermediate density soft tissue mass inseparable from
the skin surface encompassing 20 x 14 x 29 millimeters (AP by
transverse by CC) which corresponds to the marked area of clinical
concern. See series 2 images 39 and 43, coronal image 51. This
lesion abuts the left retromandibular vein. The underlying left
parotid gland appears atrophic or absent. There are small but
conspicuous rounded areas of soft tissue nodularity in the region
which are probably intraparotid lymph nodes. These measure up to 4
millimeters diameter each (series 2, image 35). The left
stylomastoid foramen remains normal.

The contralateral right parotid gland also appears atrophic but
otherwise normal. The right parotid space lymph nodes are smaller
and less numerous than those on the left.

Thyroid: Negative.

Lymph nodes: In addition to the small left parotid space lymph nodes
there are abnormally rounded and heterogeneous small nodes at the
left level 1A station (9 millimeters, series 2, image 65 and
sagittal image 77), left level IIa station (8 millimeter series 2,
image 44), and left level IIIb or level 5 nodal station (7
millimeters series 2, image 60 and sagittal image 101.

The remaining lymph node stations are within normal limits.

Vascular: Major vascular structures in the neck and at the skull
base are patent. Bilateral carotid bifurcation soft and calcified
plaque. Distal vertebral artery and bilateral ICA siphon calcified
plaque.

Limited intracranial: Head CT findings today are reported
separately.

Visualized orbits: Negative, postoperative changes to both globes.

Mastoids and visualized paranasal sinuses: Mild paranasal sinus
mucosal thickening and/or small mucous retention cysts in the
maxillary sinuses. Tympanic cavities and mastoids are clear.

Skeleton: Absent maxillary dentition. Degenerative changes
throughout the spine. No destructive or suspicious osseous lesion
identified.

Upper chest: Negative visible mediastinum. Visible axillary lymph
nodes are within normal limits. Calcified aortic atherosclerosis.
Negative visible lung parenchyma aside from mild dependent
atelectasis.
IMPRESSION: 1. Two dermis associated irregular soft tissue masses along the left
face.
The larger corresponding to the marked area of clinical concern
measures up to 2.9 cm with spiculated extension into the left
parotid space.
The smaller area located lateral to the left submandibular gland
measures up to 1.6 cm, extends deep to the platysma, but remains
separate from the left SMG.
2. Small but abnormally rounded and suspicious lymph nodes at the
left level Ia, left level IIa, left level IIIb/V, and left parotid
space nodal stations. Correlation with PET-CT may be valuable for
treatment planning.
3. Head CT today reported separately.

## 2019-11-23 ENCOUNTER — Ambulatory Visit: Payer: Medicare Other | Attending: Internal Medicine

## 2019-11-23 DIAGNOSIS — Z20822 Contact with and (suspected) exposure to covid-19: Secondary | ICD-10-CM

## 2019-11-24 LAB — NOVEL CORONAVIRUS, NAA: SARS-CoV-2, NAA: NOT DETECTED

## 2020-04-14 ENCOUNTER — Emergency Department: Payer: Medicare Other

## 2020-04-14 ENCOUNTER — Observation Stay
Admission: EM | Admit: 2020-04-14 | Discharge: 2020-04-15 | Disposition: A | Discharge status: Home / Self Care | Payer: Medicare Other | Attending: Internal Medicine | Admitting: Internal Medicine

## 2020-04-14 ENCOUNTER — Other Ambulatory Visit: Payer: Self-pay

## 2020-04-14 DIAGNOSIS — I129 Hypertensive chronic kidney disease with stage 1 through stage 4 chronic kidney disease, or unspecified chronic kidney disease: Secondary | ICD-10-CM | POA: Insufficient documentation

## 2020-04-14 DIAGNOSIS — R197 Diarrhea, unspecified: Secondary | ICD-10-CM | POA: Insufficient documentation

## 2020-04-14 DIAGNOSIS — Z79899 Other long term (current) drug therapy: Secondary | ICD-10-CM | POA: Insufficient documentation

## 2020-04-14 DIAGNOSIS — M6281 Muscle weakness (generalized): Secondary | ICD-10-CM | POA: Diagnosis not present

## 2020-04-14 DIAGNOSIS — E1122 Type 2 diabetes mellitus with diabetic chronic kidney disease: Secondary | ICD-10-CM | POA: Insufficient documentation

## 2020-04-14 DIAGNOSIS — N1831 Chronic kidney disease, stage 3a: Secondary | ICD-10-CM | POA: Insufficient documentation

## 2020-04-14 DIAGNOSIS — Z85820 Personal history of malignant melanoma of skin: Secondary | ICD-10-CM | POA: Diagnosis not present

## 2020-04-14 DIAGNOSIS — C4499 Other specified malignant neoplasm of skin, unspecified: Secondary | ICD-10-CM

## 2020-04-14 DIAGNOSIS — J1282 Pneumonia due to coronavirus disease 2019: Secondary | ICD-10-CM | POA: Insufficient documentation

## 2020-04-14 DIAGNOSIS — I480 Paroxysmal atrial fibrillation: Secondary | ICD-10-CM | POA: Insufficient documentation

## 2020-04-14 DIAGNOSIS — D61818 Other pancytopenia: Secondary | ICD-10-CM

## 2020-04-14 DIAGNOSIS — B20 Human immunodeficiency virus [HIV] disease: Secondary | ICD-10-CM | POA: Diagnosis not present

## 2020-04-14 DIAGNOSIS — Z7984 Long term (current) use of oral hypoglycemic drugs: Secondary | ICD-10-CM | POA: Insufficient documentation

## 2020-04-14 DIAGNOSIS — E785 Hyperlipidemia, unspecified: Secondary | ICD-10-CM | POA: Insufficient documentation

## 2020-04-14 DIAGNOSIS — I452 Bifascicular block: Secondary | ICD-10-CM | POA: Insufficient documentation

## 2020-04-14 DIAGNOSIS — N400 Enlarged prostate without lower urinary tract symptoms: Secondary | ICD-10-CM | POA: Insufficient documentation

## 2020-04-14 DIAGNOSIS — U071 COVID-19: Principal | ICD-10-CM | POA: Insufficient documentation

## 2020-04-14 DIAGNOSIS — R531 Weakness: Secondary | ICD-10-CM

## 2020-04-14 DIAGNOSIS — I10 Essential (primary) hypertension: Secondary | ICD-10-CM

## 2020-04-14 DIAGNOSIS — K219 Gastro-esophageal reflux disease without esophagitis: Secondary | ICD-10-CM | POA: Insufficient documentation

## 2020-04-14 DIAGNOSIS — A0839 Other viral enteritis: Secondary | ICD-10-CM

## 2020-04-14 DIAGNOSIS — Z87891 Personal history of nicotine dependence: Secondary | ICD-10-CM | POA: Diagnosis not present

## 2020-04-14 LAB — CBC WITH DIFFERENTIAL/PLATELET
Abs Immature Granulocytes: 0.01 10*3/uL (ref 0.00–0.07)
Basophils Absolute: 0 10*3/uL (ref 0.0–0.1)
Basophils Relative: 0 %
Eosinophils Absolute: 0 10*3/uL (ref 0.0–0.5)
Eosinophils Relative: 0 %
HCT: 36.3 % — ABNORMAL LOW (ref 39.0–52.0)
Hemoglobin: 12.5 g/dL — ABNORMAL LOW (ref 13.0–17.0)
Immature Granulocytes: 0 %
Lymphocytes Relative: 25 %
Lymphs Abs: 0.8 10*3/uL (ref 0.7–4.0)
MCH: 30.2 pg (ref 26.0–34.0)
MCHC: 34.4 g/dL (ref 30.0–36.0)
MCV: 87.7 fL (ref 80.0–100.0)
Monocytes Absolute: 0.4 10*3/uL (ref 0.1–1.0)
Monocytes Relative: 12 %
Neutro Abs: 2.1 10*3/uL (ref 1.7–7.7)
Neutrophils Relative %: 63 %
Platelets: 127 10*3/uL — ABNORMAL LOW (ref 150–400)
RBC: 4.14 MIL/uL — ABNORMAL LOW (ref 4.22–5.81)
RDW: 13.3 % (ref 11.5–15.5)
WBC: 3.3 10*3/uL — ABNORMAL LOW (ref 4.0–10.5)
nRBC: 0 % (ref 0.0–0.2)

## 2020-04-14 LAB — COMPREHENSIVE METABOLIC PANEL
ALT: 14 U/L (ref 0–44)
AST: 21 U/L (ref 15–41)
Albumin: 3.4 g/dL — ABNORMAL LOW (ref 3.5–5.0)
Alkaline Phosphatase: 46 U/L (ref 38–126)
Anion gap: 9 (ref 5–15)
BUN: 34 mg/dL — ABNORMAL HIGH (ref 8–23)
CO2: 25 mmol/L (ref 22–32)
Calcium: 8 mg/dL — ABNORMAL LOW (ref 8.9–10.3)
Chloride: 105 mmol/L (ref 98–111)
Creatinine, Ser: 1.22 mg/dL (ref 0.61–1.24)
GFR calc Af Amer: 58 mL/min — ABNORMAL LOW (ref >60)
GFR calc non Af Amer: 50 mL/min — ABNORMAL LOW (ref >60)
Glucose, Bld: 193 mg/dL — ABNORMAL HIGH (ref 70–99)
Potassium: 3.4 mmol/L — ABNORMAL LOW (ref 3.5–5.1)
Sodium: 139 mmol/L (ref 135–145)
Total Bilirubin: 0.8 mg/dL (ref 0.3–1.2)
Total Protein: 6.2 g/dL — ABNORMAL LOW (ref 6.5–8.1)

## 2020-04-14 LAB — LACTATE DEHYDROGENASE: LDH: 166 U/L (ref 98–192)

## 2020-04-14 LAB — TROPONIN I (HIGH SENSITIVITY)
Troponin I (High Sensitivity): 10 ng/L (ref <18)
Troponin I (High Sensitivity): 12 ng/L (ref <18)

## 2020-04-14 LAB — SARS CORONAVIRUS 2 BY RT PCR (HOSPITAL ORDER, PERFORMED IN ~~LOC~~ HOSPITAL LAB): SARS Coronavirus 2: POSITIVE — AB

## 2020-04-14 LAB — GLUCOSE, CAPILLARY: Glucose-Capillary: 143 mg/dL — ABNORMAL HIGH (ref 70–99)

## 2020-04-14 LAB — FIBRINOGEN: Fibrinogen: 530 mg/dL — ABNORMAL HIGH (ref 210–475)

## 2020-04-14 LAB — FERRITIN: Ferritin: 85 ng/mL (ref 24–336)

## 2020-04-14 LAB — FIBRIN DERIVATIVES D-DIMER (ARMC ONLY): Fibrin derivatives D-dimer (ARMC): 544.95 ng/mL (FEU) — ABNORMAL HIGH (ref 0.00–499.00)

## 2020-04-14 LAB — LIPASE, BLOOD: Lipase: 41 U/L (ref 11–51)

## 2020-04-14 LAB — PROCALCITONIN: Procalcitonin: <0.1 ng/mL

## 2020-04-14 MED ORDER — ADULT MULTIVITAMIN W/MINERALS CH
1.0000 | ORAL_TABLET | Freq: Every day | ORAL | Status: DC
  Administered 2020-04-14 – 2020-04-15 (×2): 1 via ORAL
  Filled 2020-04-14 (×2): qty 1

## 2020-04-14 MED ORDER — ACETAMINOPHEN 325 MG PO TABS: 650.0000 mg | ORAL_TABLET | Freq: Four times a day (QID) | ORAL | Status: DC | PRN

## 2020-04-14 MED ORDER — ENOXAPARIN SODIUM 40 MG/0.4ML ~~LOC~~ SOLN
40.0000 mg | SUBCUTANEOUS | Status: DC
  Administered 2020-04-14: 40 mg via SUBCUTANEOUS
  Filled 2020-04-14: qty 0.4

## 2020-04-14 MED ORDER — INSULIN ASPART 100 UNIT/ML ~~LOC~~ SOLN
0.0000 [IU] | Freq: Three times a day (TID) | SUBCUTANEOUS | Status: DC
  Filled 2020-04-14: qty 1

## 2020-04-14 MED ORDER — SODIUM CHLORIDE 0.9 % IV SOLN
100.0000 mg | Freq: Every day | INTRAVENOUS | Status: DC
  Administered 2020-04-15: 100 mg via INTRAVENOUS
  Filled 2020-04-14: qty 100
  Filled 2020-04-14: qty 20

## 2020-04-14 MED ORDER — ZINC SULFATE 220 (50 ZN) MG PO CAPS
220.0000 mg | ORAL_CAPSULE | Freq: Every day | ORAL | Status: DC
  Administered 2020-04-14 – 2020-04-15 (×2): 220 mg via ORAL
  Filled 2020-04-14 (×2): qty 1

## 2020-04-14 MED ORDER — ONDANSETRON HCL 4 MG/2ML IJ SOLN: 4.0000 mg | Freq: Four times a day (QID) | INTRAMUSCULAR | Status: DC | PRN

## 2020-04-14 MED ORDER — ASCORBIC ACID 500 MG PO TABS
500.0000 mg | ORAL_TABLET | Freq: Every day | ORAL | Status: DC
  Administered 2020-04-14 – 2020-04-15 (×2): 500 mg via ORAL
  Filled 2020-04-14 (×2): qty 1

## 2020-04-14 MED ORDER — INSULIN ASPART 100 UNIT/ML ~~LOC~~ SOLN: 0.0000 [IU] | Freq: Every day | SUBCUTANEOUS | Status: DC

## 2020-04-14 MED ORDER — ONDANSETRON HCL 4 MG PO TABS
4.0000 mg | ORAL_TABLET | Freq: Four times a day (QID) | ORAL | Status: DC | PRN
  Filled 2020-04-14: qty 1

## 2020-04-14 MED ORDER — ONDANSETRON HCL 4 MG/2ML IJ SOLN
4.0000 mg | Freq: Once | INTRAMUSCULAR | Status: AC
  Administered 2020-04-14: 4 mg via INTRAVENOUS
  Filled 2020-04-14: qty 2

## 2020-04-14 MED ORDER — SODIUM CHLORIDE 0.9 % IV SOLN
200.0000 mg | Freq: Once | INTRAVENOUS | Status: AC
  Administered 2020-04-14: 200 mg via INTRAVENOUS
  Filled 2020-04-14: qty 40

## 2020-04-14 MED ORDER — LACTATED RINGERS IV BOLUS
1000.0000 mL | Freq: Once | INTRAVENOUS | Status: AC
  Administered 2020-04-14: 1000 mL via INTRAVENOUS

## 2020-04-14 NOTE — ED Triage Notes (Signed)
Patient arrived via EMS from home. Patient is AOx4 and ambulatory with some assistance. Patient called 911 due to feeling lethargic, having nausea w/o emesis, and 1-2 episodes of diarrhea. No pain, SOB or any other complaints.  Patient was recently exposed to COVID-19 from family member.

## 2020-04-14 NOTE — ED Notes (Signed)
Pt given incentive spirometer and taught about use. Pt demonstrated appropriately. Pt with diarrhea x1 in toilet, ambulated to toilet with one assist.

## 2020-04-14 NOTE — ED Notes (Signed)
Pt given meal tray and coffee per request

## 2020-04-14 NOTE — ED Provider Notes (Signed)
[facility] Emergency Department Provider Note   ____________________________________________   First MD Initiated Contact with Patient 04/14/20 1720     (approximate)  I have reviewed the triage vital signs and the nursing notes.   HISTORY  Chief Complaint Weakness   HPI Jeffery Avila is a 86 y.o. male with past medical history of hypertension, hyperlipidemia, diabetes, and GERD who presents to the ED complaining of generalized weakness.  Patient reports that he has been feeling increasingly weak over the past couple of days along with nausea and a poor appetite.  He states he has had almost nothing to eat due to this lack of appetite, has also been dealing with diarrhea.  He denies any blood in his stool and has not had any abdominal pain or vomiting.  He denies any fevers, cough, chest pain, or shortness of breath, however was recently exposed to his son who tested positive for COVID-19 earlier today.  Patient has received 2 doses of the Covid vaccine greater than 2 weeks ago.        Past Medical History:  Diagnosis Date  . BPH (benign prostatic hyperplasia)   . Diabetes mellitus without complication ([place])   . Dyspnea    DOE  . GERD (gastroesophageal reflux disease)   . History of hiatal hernia   . HOH (hard of hearing)    AIDS  . Hypertension   . Skin cancer    COLON/ SKIN/MELANOMA    There are no problems to display for this patient.   Past Surgical History:  Procedure Laterality Date  . APPENDECTOMY    . CATARACT EXTRACTION W/PHACO Right 06/07/2018   Procedure: CATARACT EXTRACTION PHACO AND INTRAOCULAR LENS PLACEMENT ([place]) suture placed in right eye at end of procedure;  Surgeon: Birder Robson, MD;  Location: [facility] ORS;  Service: Ophthalmology;  Laterality: Right;  [place] 00:56.5 AP% 15.6 CDE 8.81 Fluid Pack Lot # [id number]  . CATARACT EXTRACTION W/PHACO Left 06/28/2018   Procedure: CATARACT EXTRACTION PHACO AND INTRAOCULAR LENS PLACEMENT  (IOC);  Surgeon: Birder Robson, MD;  Location: [facility] ORS;  Service: Ophthalmology;  Laterality: Left;  [place]  00:53 AP% 14.4 CDE 7.72 Fluid pack lot # [id number]  . SKIN CANCER EXCISION      Prior to Admission medications   Medication Sig Start Date End Date Taking? Authorizing Provider  calcipotriene (DOVONOX) 0.005 % cream Apply once daily for 4 consecutive days each month. 02/15/17   [provider]  finasteride (PROSCAR) 5 MG tablet Take 5 mg by mouth daily.    [provider]  fluorouracil (EFUDEX) 5 % cream Apply once daily for 4 consecutive days each month. 02/15/17   [provider]  hydrochlorothiazide (HYDRODIURIL) 25 MG tablet Take 25 mg by mouth.    [provider]  lisinopril (PRINIVIL,ZESTRIL) 40 MG tablet Take 40 mg by mouth.    [provider]  metFORMIN (GLUCOPHAGE) 500 MG tablet Take by mouth daily with breakfast.    [provider]  mirabegron ER (MYRBETRIQ) 25 MG TB24 tablet Take 25 mg by mouth. 01/27/16   [provider]  silver sulfADIAZINE (SILVADENE) 1 % cream Apply to affected area daily Patient not taking: Reported on 04/26/2017 07/05/15   Menshew, Dannielle Karvonen, PA-C  simvastatin (ZOCOR) 80 MG tablet Take 80 mg by mouth.    [provider]  traMADol (ULTRAM) 50 MG tablet Take 50 mg by mouth. 04/19/17   [provider]    Allergies Patient has  no known allergies.  No family history on file.  Social History Social History   Tobacco Use  . Smoking status: Former Research scientist (life sciences)  . Smokeless tobacco: Never Used  Substance Use Topics  . Alcohol use: Not Currently  . Drug use: Not on file    Review of Systems  Constitutional: No fever/chills.  Positive for generalized weakness. Eyes: No visual changes. ENT: No sore throat. Cardiovascular: Denies chest pain. Respiratory: Denies shortness of breath. Gastrointestinal: No abdominal pain.  Positive for poor appetite and nausea, no vomiting.   Positive for diarrhea.  No constipation. Genitourinary: Negative for dysuria. Musculoskeletal: Negative for back pain. Skin: Negative for rash. Neurological: Negative for headaches, focal weakness or numbness.  ____________________________________________   PHYSICAL EXAM:  VITAL SIGNS: ED Triage Vitals  Enc Vitals Group     BP      Pulse      Resp      Temp      Temp src      SpO2      Weight      Height      Head Circumference      Peak Flow      Pain Score      Pain Loc      Pain Edu?      Excl. in [place]?     Constitutional: Alert and oriented. Eyes: Conjunctivae are normal. Head: Atraumatic. Nose: No congestion/rhinnorhea. Mouth/Throat: Mucous membranes are dry. Neck: Normal ROM Cardiovascular: Normal rate, regular rhythm. Grossly normal heart sounds. Respiratory: Normal respiratory effort.  No retractions. Lungs CTAB. Gastrointestinal: Soft and nontender. No distention. Genitourinary: deferred Musculoskeletal: No lower extremity tenderness nor edema. Neurologic:  Normal speech and language. No gross focal neurologic deficits are appreciated. Skin:  Skin is warm, dry and intact. No rash noted. Psychiatric: Mood and affect are normal. Speech and behavior are normal.  ____________________________________________   LABS (all labs ordered are listed, but only abnormal results are displayed)  Labs Reviewed  SARS CORONAVIRUS 2 BY RT PCR (HOSPITAL ORDER, [facility]) - Abnormal; Notable for the following components:      Result Value   SARS Coronavirus 2 POSITIVE (*)    All other components within normal limits  COMPREHENSIVE METABOLIC PANEL - Abnormal; Notable for the following components:   Potassium 3.4 (*)    Glucose, Bld 193 (*)    BUN 34 (*)    Calcium 8.0 (*)    Total Protein 6.2 (*)    Albumin 3.4 (*)    GFR calc non Af Amer 50 (*)    GFR calc Af Amer 58 (*)    All other components within normal limits  CBC WITH  DIFFERENTIAL/PLATELET - Abnormal; Notable for the following components:   WBC 3.3 (*)    RBC 4.14 (*)    Hemoglobin 12.5 (*)    HCT 36.3 (*)    Platelets 127 (*)    All other components within normal limits  LIPASE, BLOOD  URINALYSIS, COMPLETE (UACMP) WITH MICROSCOPIC  PROCALCITONIN  TROPONIN I (HIGH SENSITIVITY)  TROPONIN I (HIGH SENSITIVITY)   ____________________________________________  EKG  ED ECG REPORT I, Blake Divine, the attending physician, personally viewed and interpreted this ECG.   Date: 04/14/2020  EKG Time: 17:22  Rate: 101  Rhythm: atrial fibrillation, rate 101  Axis: Normal  Intervals:right bundle branch block and left anterior fascicular block  ST&T Change: None    PROCEDURES  Procedure(s) performed (including Critical Care):  Procedures  ____________________________________________   INITIAL IMPRESSION / ASSESSMENT AND PLAN / ED COURSE       86 year old male with past medical history of hypertension, hyperlipidemia, diabetes, and GERD presents to the ED with generalized weakness for the past few days along with diarrhea and recent exposure for COVID-19.  He is not in any respiratory distress and denies any chest pain or shortness of breath, is maintaining O2 sats on room air.  Lab work is unremarkable, but chest x-ray consistent with atypical pneumonia and COVID-19 testing is positive.  I have a low suspicion for bacterial pneumonia, will check procalcitonin but hold off on antibiotics for now.  Patient was hydrated with IV fluids and treated with IV Zofran.  Given his generalized weakness and difficulty caring for himself in the setting of COVID-19, case was discussed with hospitalist for admission.      ____________________________________________   FINAL CLINICAL IMPRESSION(S) / ED DIAGNOSES  Final diagnoses:  Pneumonia due to COVID-19 virus  Generalized weakness  Diarrhea, unspecified type     ED Discharge Orders    None        Note:  This document was prepared using Dragon voice recognition software and may include unintentional dictation errors.   Blake Divine, MD 04/14/20 [phone]

## 2020-04-14 NOTE — Progress Notes (Signed)
Remdesivir - Pharmacy Brief Note   O:  CXR: Possible early atypical pneumonia  A/P:  Remdesivir 200 mg IVPB once followed by 100 mg IVPB daily x 4 days.   Paulina Fusi, PharmD, BCPS 04/14/2020 8:08 PM

## 2020-04-14 NOTE — H&P (Signed)
History and Physical    FLORENCIO HOLLIBAUGH DXI:[id number] DOB: 1923-04-06 DOA: 04/14/2020  PCP: Patient, No Pcp Per   Patient coming from: Home  I have personally briefly reviewed patient's old medical records in [place]  Chief Complaint: Nausea, anorexia, diarrhea, weakness Covid exposure  HPI: DELRICO MINEHART is a 86 y.o. male with medical history significant for hypertension, diabetes, BPH, recurrent skin cancer, who presents to the emergency room with a 3-day history of generalized weakness, nausea without vomiting, with poor appetite and poor oral intake as well as loose stool.  Diarrhea is runny without blood, and not black and has had about 2 episodes per day.Marland Kitchen  He reports being exposed to his son who is Covid positive.  He denies abdominal pain, cough or shortness of breath, fever or chills.  Patient is vaccinated against Covid with last shot greater than 2 weeks prior. ED Course: On arrival he was afebrile with normal vitals except for mild tachypnea of 22 with O2 sat around 92 at rest.  Covid PCR was positive.  Chest x-ray showed hazy bibasilar airspace opacities which may represent atelectasis or developing infiltrate.  His blood work was significant for mild pancytopenia with WBC 3300, hemoglobin 12.5 and platelets 127.  Lipase and liver enzymes normal, troponin was 10, procalcitonin less than 0.1.  Of note, A. fib noted on EKG with no prior history of same.  Hospitalist consulted for admission.  Review of Systems: As per HPI otherwise all other systems on review of systems negative.    Past Medical History:  Diagnosis Date  . BPH (benign prostatic hyperplasia)   . Diabetes mellitus without complication ([place])   . Dyspnea    DOE  . GERD (gastroesophageal reflux disease)   . History of hiatal hernia   . HOH (hard of hearing)    AIDS  . Hypertension   . Skin cancer    COLON/ SKIN/MELANOMA    Past Surgical History:  Procedure Laterality Date  . APPENDECTOMY    .  CATARACT EXTRACTION W/PHACO Right 06/07/2018   Procedure: CATARACT EXTRACTION PHACO AND INTRAOCULAR LENS PLACEMENT ([place]) suture placed in right eye at end of procedure;  Surgeon: Birder Robson, MD;  Location: [facility] ORS;  Service: Ophthalmology;  Laterality: Right;  [place] 00:56.5 AP% 15.6 CDE 8.81 Fluid Pack Lot # [id number]  . CATARACT EXTRACTION W/PHACO Left 06/28/2018   Procedure: CATARACT EXTRACTION PHACO AND INTRAOCULAR LENS PLACEMENT ([facility]);  Surgeon: Birder Robson, MD;  Location: [facility] ORS;  Service: Ophthalmology;  Laterality: Left;  [place]  00:53 AP% 14.4 CDE 7.72 Fluid pack lot # [id number]  . SKIN CANCER EXCISION       reports that he has quit smoking. He has never used smokeless tobacco. He reports previous alcohol use. No history on file for drug use.  No Known Allergies  History reviewed. No pertinent family history.    Prior to Admission medications   Medication Sig Start Date End Date Taking? Authorizing Provider  finasteride (PROSCAR) 5 MG tablet Take 5 mg by mouth daily.   Yes [provider]  fluorouracil (EFUDEX) 5 % cream Apply once daily for 4 consecutive days each month. 02/15/17  Yes [provider]  hydrochlorothiazide (HYDRODIURIL) 25 MG tablet Take 25 mg by mouth.   Yes [provider]  lisinopril (PRINIVIL,ZESTRIL) 40 MG tablet Take 40 mg by mouth.   Yes [provider]  metFORMIN (GLUCOPHAGE) 500 MG tablet Take by mouth daily with breakfast.   Yes [provider]  simvastatin (ZOCOR) 80 MG tablet Take 80 mg by mouth.   Yes [provider]    Physical Exam: Vitals:   04/14/20 1730 04/14/20 1746 04/14/20 1800  BP: (!) 136/56  132/72  Pulse: 85  85  Resp: 17  (!) 22  Temp: 98.9 F (37.2 C)    TempSrc: Oral    SpO2: 92%  92%  Weight:  80 kg   Height:  5\' 9"  (1.753 m)      Vitals:   04/14/20 1730 04/14/20 1746 04/14/20 1800  BP: (!) 136/56  132/72  Pulse: 85  85  Resp: 17  (!) 22  Temp: 98.9 F (37.2  C)    TempSrc: Oral    SpO2: 92%  92%  Weight:  80 kg   Height:  5\' 9"  (1.753 m)       Constitutional: Alert and oriented x 3 but appears restless HEENT:      Head: Normocephalic and atraumatic.         Eyes: PERLA, EOMI, Conjunctivae are normal. Sclera is non-icteric.       Mouth/Throat: Mucous membranes are moist.       Neck: Supple with no signs of meningismus. Cardiovascular: Regular rate and rhythm. No murmurs, gallops, or rubs. 2+ symmetrical distal pulses are present . No JVD. No LE edema Respiratory: Respiratory effort normal .Lungs sounds clear bilaterally. No wheezes, crackles, or rhonchi.  Gastrointestinal: Soft, non tender, and non distended with positive bowel sounds. No rebound or guarding. Genitourinary: No CVA tenderness. Musculoskeletal: Nontender with normal range of motion in all extremities. No cyanosis, or erythema of extremities. Neurologic: Normal speech and language. Face is symmetric. Moving all extremities. No gross focal neurologic deficits . Skin:  Actinic keratoses, and multiple hypopigmented areas likely related to previous skin cancer treatment  psychiatric: Patient restless and appears aggravated  Labs on Admission: I have personally reviewed following labs and imaging studies  CBC: Recent Labs  Lab 04/14/20 1723  WBC 3.3*  NEUTROABS 2.1  HGB 12.5*  HCT 36.3*  MCV 87.7  PLT 166*   Basic Metabolic Panel: Recent Labs  Lab 04/14/20 1723  NA 139  K 3.4*  CL 105  CO2 25  GLUCOSE 193*  BUN 34*  CREATININE 1.22  CALCIUM 8.0*   GFR: Estimated Creatinine Clearance: 37 mL/min (by C-G formula based on SCr of 1.22 mg/dL). Liver Function Tests: Recent Labs  Lab 04/14/20 1723  AST 21  ALT 14  ALKPHOS 46  BILITOT 0.8  PROT 6.2*  ALBUMIN 3.4*   Recent Labs  Lab 04/14/20 1723  LIPASE 41   No results for input(s): AMMONIA in the last 168 hours. Coagulation Profile: No results for input(s): INR, PROTIME in the last 168  hours. Cardiac Enzymes: No results for input(s): CKTOTAL, CKMB, CKMBINDEX, TROPONINI in the last 168 hours. BNP (last 3 results) No results for input(s): PROBNP in the last 8760 hours. HbA1C: No results for input(s): HGBA1C in the last 72 hours. CBG: No results for input(s): GLUCAP in the last 168 hours. Lipid Profile: No results for input(s): CHOL, HDL, LDLCALC, TRIG, CHOLHDL, LDLDIRECT in the last 72 hours. Thyroid Function Tests: No results for input(s): TSH, T4TOTAL, FREET4, T3FREE, THYROIDAB in the last 72 hours. Anemia Panel: No results for input(s): VITAMINB12, FOLATE, FERRITIN, TIBC, IRON, RETICCTPCT in the last 72 hours. Urine analysis:    Component Value Date/Time   COLORURINE Yellow 09/08/2013 0933   APPEARANCEUR Clear 09/08/2013 0933   LABSPEC 1.018  09/08/2013 0933   PHURINE 6.0 09/08/2013 0933   GLUCOSEU Negative 09/08/2013 0933   HGBUR Negative 09/08/2013 0933   BILIRUBINUR Negative 09/08/2013 0933   KETONESUR Negative 09/08/2013 0933   PROTEINUR Negative 09/08/2013 0933   NITRITE Negative 09/08/2013 0933   LEUKOCYTESUR Negative 09/08/2013 0933    Radiological Exams on Admission: DG Chest Portable 1 View  Result Date: 04/14/2020 CLINICAL DATA:  Weakness. EXAM: PORTABLE CHEST 1 VIEW COMPARISON:  September 08, 2013 FINDINGS: The heart size is stable from prior study. Aortic calcifications are noted. There are hazy bibasilar airspace opacities. No pneumothorax. No significant pleural effusion. No definite acute osseous abnormality. IMPRESSION: Hazy bibasilar airspace opacities which may represent atelectasis or developing infiltrate. Electronically Signed   By: Constance Holster M.D.   On: 04/14/2020 18:42    EKG: Independently reviewed. Interpretation : A. fib with response of 101.  No acute ST-T wave changes  Assessment/Plan  86 year old COVID vaccinated male with history of DM, HTN, BPH, recently exposed to son who is currently Covid positive presenting with  lethargy, nausea diarrhea and poor appetite.  Tested positive for Covid.  Early infiltrate seen on chest x-ray    Gastroenteritis due to COVID-19 virus -Patient presents with nausea anorexia and diarrhea with Covid positive testing -Remdesivir, vitamins -Got IV fluid bolus in the emergency room.  As needed fluids, especially in view of possible developing pneumonia    Suspect early Pneumonia due to COVID-19 virus -Patient is without cough or shortness of breath but has early infiltrate on chest x-ray, borderline hypoxia at 92 on room air and known Covid positive -Patient received his second Covid shot over 2 weeks prior -Continue IV remdesivir as above and vitamins.  No dexamethasone for now as not yet hypoxic -Monitor for development of respiratory symptoms    Generalized weakness -Secondary to above. -Continue treatment above and can consider physical therapy evaluation    Pancytopenia ([place]) -Very mildly depressed WBC, Hb and platelets at 3300, 12.5 and 127 respectively -Suspect in part related to Covid, but more so to chemotherapy treatment with Cemiplimab by dermatology for recurrent skin cancer -Continue to monitor    HTN (hypertension) -Continue home antihypertensive of lisinopril    Type 2 diabetes mellitus without complication (H[facility]) -Sliding scale insulin coverage for now.    AF (paroxysmal atrial fibrillation) ([place]), suspect new onset -EKG with atrial fibrillation, although now in sinus and rate controlled -Possibly related to acute infection -Continue cardiac monitoring -CHA2DS2-VASc score of 3.  If recurrent, consider systemic anticoagulation for stroke prevention    BPH (benign prostatic hyperplasia) -No acute concerns.  Continue finasteride    Recurrent skin cancer, basal and squamous cell cell carcinoma -Followed by [facility] dermatology -Continue Efudex cream.  Has been treated with chemotherapy infusions some months prior    DVT prophylaxis: Lovenox  Code Status:  full code  Family Communication:  none  Disposition Plan: Back to previous home environment Consults called: none  Status:obs    Athena Masse MD [facility]     04/14/2020, 8:02 PM

## 2020-04-15 ENCOUNTER — Encounter: Payer: Self-pay | Admitting: Internal Medicine

## 2020-04-15 DIAGNOSIS — N1831 Chronic kidney disease, stage 3a: Secondary | ICD-10-CM

## 2020-04-15 DIAGNOSIS — R531 Weakness: Secondary | ICD-10-CM | POA: Diagnosis not present

## 2020-04-15 DIAGNOSIS — E1121 Type 2 diabetes mellitus with diabetic nephropathy: Secondary | ICD-10-CM

## 2020-04-15 DIAGNOSIS — I10 Essential (primary) hypertension: Secondary | ICD-10-CM

## 2020-04-15 DIAGNOSIS — D61818 Other pancytopenia: Secondary | ICD-10-CM

## 2020-04-15 DIAGNOSIS — U071 COVID-19: Secondary | ICD-10-CM | POA: Diagnosis not present

## 2020-04-15 DIAGNOSIS — A0839 Other viral enteritis: Secondary | ICD-10-CM

## 2020-04-15 LAB — COMPREHENSIVE METABOLIC PANEL
ALT: 13 U/L (ref 0–44)
AST: 20 U/L (ref 15–41)
Albumin: 2.8 g/dL — ABNORMAL LOW (ref 3.5–5.0)
Alkaline Phosphatase: 44 U/L (ref 38–126)
Anion gap: 10 (ref 5–15)
BUN: 30 mg/dL — ABNORMAL HIGH (ref 8–23)
CO2: 25 mmol/L (ref 22–32)
Calcium: 7.8 mg/dL — ABNORMAL LOW (ref 8.9–10.3)
Chloride: 106 mmol/L (ref 98–111)
Creatinine, Ser: 1.13 mg/dL (ref 0.61–1.24)
GFR calc Af Amer: >60 mL/min (ref >60)
GFR calc non Af Amer: 55 mL/min — ABNORMAL LOW (ref >60)
Glucose, Bld: 137 mg/dL — ABNORMAL HIGH (ref 70–99)
Potassium: 3.7 mmol/L (ref 3.5–5.1)
Sodium: 141 mmol/L (ref 135–145)
Total Bilirubin: 0.6 mg/dL (ref 0.3–1.2)
Total Protein: 5.6 g/dL — ABNORMAL LOW (ref 6.5–8.1)

## 2020-04-15 LAB — FIBRIN DERIVATIVES D-DIMER (ARMC ONLY): Fibrin derivatives D-dimer (ARMC): 504.09 ng/mL (FEU) — ABNORMAL HIGH (ref 0.00–499.00)

## 2020-04-15 LAB — CBC WITH DIFFERENTIAL/PLATELET
Abs Immature Granulocytes: 0.01 10*3/uL (ref 0.00–0.07)
Basophils Absolute: 0 10*3/uL (ref 0.0–0.1)
Basophils Relative: 0 %
Eosinophils Absolute: 0 10*3/uL (ref 0.0–0.5)
Eosinophils Relative: 0 %
HCT: 34 % — ABNORMAL LOW (ref 39.0–52.0)
Hemoglobin: 11.6 g/dL — ABNORMAL LOW (ref 13.0–17.0)
Immature Granulocytes: 0 %
Lymphocytes Relative: 21 %
Lymphs Abs: 0.6 10*3/uL — ABNORMAL LOW (ref 0.7–4.0)
MCH: 29.7 pg (ref 26.0–34.0)
MCHC: 34.1 g/dL (ref 30.0–36.0)
MCV: 87 fL (ref 80.0–100.0)
Monocytes Absolute: 0.4 10*3/uL (ref 0.1–1.0)
Monocytes Relative: 14 %
Neutro Abs: 2 10*3/uL (ref 1.7–7.7)
Neutrophils Relative %: 65 %
Platelets: 125 10*3/uL — ABNORMAL LOW (ref 150–400)
RBC: 3.91 MIL/uL — ABNORMAL LOW (ref 4.22–5.81)
RDW: 13.4 % (ref 11.5–15.5)
WBC: 3 10*3/uL — ABNORMAL LOW (ref 4.0–10.5)
nRBC: 0 % (ref 0.0–0.2)

## 2020-04-15 LAB — HEMOGLOBIN A1C
Hgb A1c MFr Bld: 6.4 % — ABNORMAL HIGH (ref 4.8–5.6)
Mean Plasma Glucose: 136.98 mg/dL

## 2020-04-15 LAB — MAGNESIUM: Magnesium: 1.6 mg/dL — ABNORMAL LOW (ref 1.7–2.4)

## 2020-04-15 LAB — C-REACTIVE PROTEIN
CRP: 3.4 mg/dL — ABNORMAL HIGH (ref <1.0)
CRP: 3.1 mg/dL — ABNORMAL HIGH (ref <1.0)

## 2020-04-15 LAB — GLUCOSE, CAPILLARY
Glucose-Capillary: 87 mg/dL (ref 70–99)
Glucose-Capillary: 197 mg/dL — ABNORMAL HIGH (ref 70–99)

## 2020-04-15 LAB — PHOSPHORUS: Phosphorus: 2.5 mg/dL (ref 2.5–4.6)

## 2020-04-15 LAB — ABO/RH: ABO/RH(D): O NEG

## 2020-04-15 MED ORDER — MAGNESIUM SULFATE 2 GM/50ML IV SOLN
2.0000 g | INTRAVENOUS | Status: AC
  Administered 2020-04-15: 13:00:00 2 g via INTRAVENOUS
  Filled 2020-04-15: qty 50

## 2020-04-15 MED ORDER — DEXAMETHASONE 4 MG PO TABS
6.0000 mg | ORAL_TABLET | Freq: Every day | ORAL | Status: DC
  Administered 2020-04-15: 09:00:00 6 mg via ORAL
  Filled 2020-04-15 (×2): qty 2

## 2020-04-15 MED ORDER — ASCORBIC ACID 500 MG PO TABS: 500.0000 mg | ORAL_TABLET | Freq: Every day | ORAL | Status: AC

## 2020-04-15 MED ORDER — FINASTERIDE 5 MG PO TABS
5.0000 mg | ORAL_TABLET | Freq: Every day | ORAL | Status: DC
  Administered 2020-04-15: 10:00:00 5 mg via ORAL
  Filled 2020-04-15: qty 1

## 2020-04-15 MED ORDER — LISINOPRIL 20 MG PO TABS
40.0000 mg | ORAL_TABLET | Freq: Every day | ORAL | Status: DC
  Administered 2020-04-15: 40 mg via ORAL
  Filled 2020-04-15: qty 2

## 2020-04-15 MED ORDER — ATORVASTATIN CALCIUM 20 MG PO TABS
40.0000 mg | ORAL_TABLET | Freq: Every day | ORAL | Status: DC
  Administered 2020-04-15: 40 mg via ORAL
  Filled 2020-04-15 (×2): qty 2

## 2020-04-15 MED ORDER — ZINC SULFATE 220 (50 ZN) MG PO CAPS: 220.0000 mg | ORAL_CAPSULE | Freq: Every day | ORAL | Status: AC

## 2020-04-15 NOTE — Evaluation (Signed)
Physical Therapy Evaluation Patient Details Name: Jeffery Avila MRN: [id number] DOB: April 19, 1923 Today's Date: 04/15/2020   History of Present Illness  Pt is a 86 y.o. male with medical history significant for hypertension, diabetes, BPH, recurrent skin cancer, who presents to the emergency room with a 3-day history of generalized weakness, nausea without vomiting, with poor appetite and poor oral intake as well as loose stool.  MD assessment includes: Gastroenteritis and suspect early Pneumonia both due to COVID-19 virus, weakness, Pancytopenia, HTN, DM, A-fib, and recurrent skin cancer.    Clinical Impression  Pt pleasant and motivated to participate during the session.  Pt performed very well during the session without the need for assistance with any functional task.  Pt demonstrated good dynamic balance during ambulation in the room including during sharp turns and while navigating tight spaces without ever reaching for support.  Pt presented with very good eccentric and concentric control during sit to/from stands from various height surfaces including from low toilet.  Pt reported feeling back to baseline physically with no PT needs desired.  Will complete PT orders at this time but will reassess pt pending a change in status upon receipt of new PT orders.      Follow Up Recommendations No PT follow up    Equipment Recommendations  None recommended by PT    Recommendations for Other Services       Precautions / Restrictions Precautions Precautions: None Restrictions Weight Bearing Restrictions: No      Mobility  Bed Mobility Overal bed mobility: Independent                Transfers Overall transfer level: Independent               General transfer comment: Good eccentric and concentric control  Ambulation/Gait Ambulation/Gait assistance: Independent Gait Distance (Feet): 45 Feet Assistive device: None Gait Pattern/deviations: WFL(Within Functional  Limits)     General Gait Details: Amb distance limited only by Covid restrictions to in room ambulation only; pt steady ambulating without an AD including during 180 deg turns and navigating tight spaces without reaching for UE support  Stairs            Wheelchair Mobility    Modified Rankin (Stroke Patients Only)       Balance Overall balance assessment: No apparent balance deficits (not formally assessed)                                           Pertinent Vitals/Pain Pain Assessment: No/denies pain    Home Living Family/patient expects to be discharged to:: Private residence Living Arrangements: Alone Available Help at Discharge: Family;Available PRN/intermittently Type of Home: House Home Access: Level entry     Home Layout: One level Home Equipment: None      Prior Function Level of Independence: Independent         Comments: Ind amb without an AD, Ind with all ADLs/IADLs, no fall history     Hand Dominance        Extremity/Trunk Assessment   Upper Extremity Assessment Upper Extremity Assessment: Overall WFL for tasks assessed    Lower Extremity Assessment Lower Extremity Assessment: Overall WFL for tasks assessed       Communication   Communication: No difficulties  Cognition Arousal/Alertness: Awake/alert Behavior During Therapy: WFL for tasks assessed/performed Overall Cognitive Status: Within Functional Limits for tasks assessed  General Comments      Exercises     Assessment/Plan    PT Assessment Patent does not need any further PT services  PT Problem List         PT Treatment Interventions      PT Goals (Current goals can be found in the Care Plan section)  Acute Rehab PT Goals PT Goal Formulation: All assessment and education complete, DC therapy    Frequency     Barriers to discharge        Co-evaluation               AM-PAC PT  "6 Clicks" Mobility  Outcome Measure Help needed turning from your back to your side while in a flat bed without using bedrails?: None Help needed moving from lying on your back to sitting on the side of a flat bed without using bedrails?: None Help needed moving to and from a bed to a chair (including a wheelchair)?: None Help needed standing up from a chair using your arms (e.g., wheelchair or bedside chair)?: None Help needed to walk in hospital room?: None Help needed climbing 3-5 steps with a railing? : None 6 Click Score: 24    End of Session   Activity Tolerance: Patient tolerated treatment well Patient left: in chair;with nursing/sitter in room;with call bell/phone within reach;with chair alarm set Nurse Communication: Mobility status PT Visit Diagnosis: Muscle weakness (generalized) (M62.81)    Time: 2536-6440 PT Time Calculation (min) (ACUTE ONLY): 25 min   Charges:   PT Evaluation $PT Eval Low Complexity: 1 Low          D. Scott Marjorie Lussier PT, DPT 04/15/20, 10:12 AM

## 2020-04-15 NOTE — Discharge Instructions (Signed)
COVID-19 Frequently Asked Questions °COVID-19 (coronavirus disease) is an infection that is caused by a large family of viruses. Some viruses cause illness in people and others cause illness in animals like camels, cats, and bats. In some cases, the viruses that cause illness in animals can spread to humans. °Where did the coronavirus come from? °In December 2019, [place] told the World Health Organization (WHO) of several cases of lung disease (human respiratory illness). These cases were linked to an open seafood and livestock market in the city of [place]. The link to the seafood and livestock market suggests that the virus may have spread from animals to humans. However, since that first outbreak in December, the virus has also been shown to spread from person to person. °What is the name of the disease and the virus? °Disease name °Early on, this disease was called novel coronavirus. This is because scientists determined that the disease was caused by a new (novel) respiratory virus. The World Health Organization (WHO) has now named the disease COVID-19, or coronavirus disease. °Virus name °The virus that causes the disease is called severe acute respiratory syndrome coronavirus 2 (SARS-CoV-2). °More information on disease and virus naming °World Health Organization (WHO): www.who.int/emergencies/diseases/novel-coronavirus-2019/technical-guidance/naming-the-coronavirus-disease-(covid-2019)-and-the-virus-that-causes-it °Who is at risk for complications from coronavirus disease? °Some people may be at higher risk for complications from coronavirus disease. This includes older adults and people who have chronic diseases, such as heart disease, diabetes, and lung disease. °If you are at higher risk for complications, take these extra precautions: °· Stay home as much as possible. °· Avoid social gatherings and travel. °· Avoid close contact with others. Stay at least 6 ft (2 m) away from others, if possible. °· Wash  your hands often with soap and water for at least 20 seconds. °· Avoid touching your face, mouth, nose, or eyes. °· Keep supplies on hand at home, such as food, medicine, and cleaning supplies. °· If you must go out in public, wear a cloth face covering or face mask. Make sure your mask covers your nose and mouth. °How does coronavirus disease spread? °The virus that causes coronavirus disease spreads easily from person to person (is contagious). You may catch the virus by: °· Breathing in droplets from an infected person. Droplets can be spread by a person breathing, speaking, singing, coughing, or sneezing. °· Touching something, like a table or a doorknob, that was exposed to the virus (contaminated) and then touching your mouth, nose, or eyes. °Can I get the virus from touching surfaces or objects? °There is still a lot that we do not know about the virus that causes coronavirus disease. Scientists are basing a lot of information on what they know about similar viruses, such as: °· Viruses cannot generally survive on surfaces for long. They need a human body (host) to survive. °· It is more likely that the virus is spread by close contact with people who are sick (direct contact), such as through: °? Shaking hands or hugging. °? Breathing in respiratory droplets that travel through the air. Droplets can be spread by a person breathing, speaking, singing, coughing, or sneezing. °· It is less likely that the virus is spread when a person touches a surface or object that has the virus on it (indirect contact). The virus may be able to enter the body if the person touches a surface or object and then touches his or her face, eyes, nose, or mouth. °Can a person spread the virus without having symptoms of the disease? °  It may be possible for the virus to spread before a person has symptoms of the disease, but this is most likely not the main way the virus is spreading. It is more likely for the virus to spread by  being in close contact with people who are sick and breathing in the respiratory droplets spread by a person breathing, speaking, singing, coughing, or sneezing. °What are the symptoms of coronavirus disease? °Symptoms vary from person to person and can range from mild to severe. Symptoms may include: °· Fever or chills. °· Cough. °· Difficulty breathing or feeling short of breath. °· Headaches, body aches, or muscle aches. °· Runny or stuffy (congested) nose. °· Sore throat. °· New loss of taste or smell. °· Nausea, vomiting, or diarrhea. °These symptoms can appear anywhere from 2 to 14 days after you have been exposed to the virus. Some people may not have any symptoms. If you develop symptoms, call your health care provider. People with severe symptoms may need hospital care. °Should I be tested for this virus? °Your health care provider will decide whether to test you based on your symptoms, history of exposure, and your risk factors. °How does a health care provider test for this virus? °Health care providers will collect samples to send for testing. Samples may include: °· Taking a swab of fluid from the back of your nose and throat, your nose, or your throat. °· Taking fluid from the lungs by having you cough up mucus (sputum) into a sterile cup. °· Taking a blood sample. °Is there a treatment or vaccine for this virus? °Currently, there is no vaccine to prevent coronavirus disease. Also, there are no medicines like antibiotics or antivirals to treat the virus. A person who becomes sick is given supportive care, which means rest and fluids. A person may also relieve his or her symptoms by using over-the-counter medicines that treat sneezing, coughing, and runny nose. These are the same medicines that a person takes for the common cold. °If you develop symptoms, call your health care provider. People with severe symptoms may need hospital care. °What can I do to protect myself and my family from this  virus? ° °  ° °You can protect yourself and your family by taking the same actions that you would take to prevent the spread of other viruses. Take the following actions: °· Wash your hands often with soap and water for at least 20 seconds. If soap and water are not available, use alcohol-based hand sanitizer. °· Avoid touching your face, mouth, nose, or eyes. °· Cough or sneeze into a tissue, sleeve, or elbow. Do not cough or sneeze into your hand or the air. °? If you cough or sneeze into a tissue, throw it away immediately and wash your hands. °· Disinfect objects and surfaces that you frequently touch every day. °· Stay away from people who are sick. °· Avoid going out in public, follow guidance from your state and local health authorities. °· Avoid crowded indoor spaces. Stay at least 6 ft (2 m) away from others. °· If you must go out in public, wear a cloth face covering or face mask. Make sure your mask covers your nose and mouth. °· Stay home if you are sick, except to get medical care. Call your health care provider before you get medical care. Your health care provider will tell you how long to stay home. °· Make sure your vaccines are up to date. Ask your health care provider what   vaccines you need. °What should I do if I need to travel? °Follow travel recommendations from your local health authority, the CDC, and WHO. °Travel information and advice °· Centers for Disease Control and Prevention (CDC): www.cdc.gov/coronavirus/2019-ncov/travelers/index.html °· World Health Organization (WHO): www.who.int/emergencies/diseases/novel-coronavirus-2019/travel-advice °Know the risks and take action to protect your health °· You are at higher risk of getting coronavirus disease if you are traveling to areas with an outbreak or if you are exposed to travelers from areas with an outbreak. °· Wash your hands often and practice good hygiene to lower the risk of catching or spreading the virus. °What should I do if I  am sick? °General instructions to stop the spread of infection °· Wash your hands often with soap and water for at least 20 seconds. If soap and water are not available, use alcohol-based hand sanitizer. °· Cough or sneeze into a tissue, sleeve, or elbow. Do not cough or sneeze into your hand or the air. °· If you cough or sneeze into a tissue, throw it away immediately and wash your hands. °· Stay home unless you must get medical care. Call your health care provider or local health authority before you get medical care. °· Avoid public areas. Do not take public transportation, if possible. °· If you can, wear a mask if you must go out of the house or if you are in close contact with someone who is not sick. Make sure your mask covers your nose and mouth. °Keep your home clean °· Disinfect objects and surfaces that are frequently touched every day. This may include: °? Counters and tables. °? Doorknobs and light switches. °? Sinks and faucets. °? Electronics such as phones, remote controls, keyboards, computers, and tablets. °· Wash dishes in hot, soapy water or use a dishwasher. Air-dry your dishes. °· Wash laundry in hot water. °Prevent infecting other household members °· Let healthy household members care for children and pets, if possible. If you have to care for children or pets, wash your hands often and wear a mask. °· Sleep in a different bedroom or bed, if possible. °· Do not share personal items, such as razors, toothbrushes, deodorant, combs, brushes, towels, and washcloths. °Where to find more information °Centers for Disease Control and Prevention (CDC) °· Information and news updates: www.cdc.gov/coronavirus/2019-ncov °World Health Organization (WHO) °· Information and news updates: www.who.int/emergencies/diseases/novel-coronavirus-2019 °· Coronavirus health topic: www.who.int/health-topics/coronavirus °· Questions and answers on COVID-19: www.who.int/news-room/q-a-detail/q-a-coronaviruses °· Global  tracker: who.sprinklr.com °American Academy of Pediatrics (AAP) °· Information for families: www.healthychildren.org/English/health-issues/conditions/chest-lungs/Pages/2019-Novel-Coronavirus.aspx °The coronavirus situation is changing rapidly. Check your local health authority website or the CDC and WHO websites for updates and news. °When should I contact a health care provider? °· Contact your health care provider if you have symptoms of an infection, such as fever or cough, and you: °? Have been near anyone who is known to have coronavirus disease. °? Have come into contact with a person who is suspected to have coronavirus disease. °? Have traveled to an area where there is an outbreak of COVID-19. °When should I get emergency medical care? °· Get help right away by calling your local emergency services (911 in the [place] if you have: °? Trouble breathing. °? Pain or pressure in your chest. °? Confusion. °? Blue-tinged lips and fingernails. °? Difficulty waking from sleep. °? Symptoms that get worse. °Let the emergency medical personnel know if you think you have coronavirus disease. °Summary °· A new respiratory virus is spreading from person to person and causing   COVID-19 (coronavirus disease). °· The virus that causes COVID-19 appears to spread easily. It spreads from one person to another through droplets from breathing, speaking, singing, coughing, or sneezing. °· Older adults and those with chronic diseases are at higher risk of disease. If you are at higher risk for complications, take extra precautions. °· There is currently no vaccine to prevent coronavirus disease. There are no medicines, such as antibiotics or antivirals, to treat the virus. °· You can protect yourself and your family by washing your hands often, avoiding touching your face, and covering your coughs and sneezes. °This information is not intended to replace advice given to you by your health care provider. Make sure you discuss any  questions you have with your health care provider. °Document Revised: 08/04/2019 Document Reviewed: 01/31/2019 °[employer]vier Patient Education © 2020 [employer] ° °COVID-19: How to Protect Yourself and Others °Know how it spreads °· There is currently no vaccine to prevent coronavirus disease 2019 (COVID-19). °· The best way to prevent illness is to avoid being exposed to this virus. °· The virus is thought to spread mainly from person-to-person. °? Between people who are in close contact with one another (within about 6 feet). °? Through respiratory droplets produced when an infected person coughs, sneezes or talks. °? These droplets can land in the mouths or noses of people who are nearby or possibly be inhaled into the lungs. °? COVID-19 may be spread by people who are not showing symptoms. °Everyone should °Clean your hands often °· Wash your hands often with soap and water for at least 20 seconds especially after you have been in a public place, or after blowing your nose, coughing, or sneezing. °· If soap and water are not readily available, use a hand sanitizer that contains at least 60% alcohol. Cover all surfaces of your hands and rub them together until they feel dry. °· Avoid touching your eyes, nose, and mouth with unwashed hands. °Avoid close contact °· Limit contact with others as much as possible. °· Avoid close contact with people who are sick. °· Put distance between yourself and other people. °? Remember that some people without symptoms may be able to spread virus. °? This is especially important for people who are at higher risk of getting very sick.www.cdc.gov/coronavirus/2019-ncov/need-extra-precautions/people-at-higher-risk.html °Cover your mouth and nose with a mask when around others °· You could spread COVID-19 to others even if you do not feel sick. °· Everyone should wear a mask in public settings and when around people not living in their household, especially when social distancing is  difficult to maintain. °? Masks should not be placed on young children under age 2, anyone who has trouble breathing, or is unconscious, incapacitated or otherwise unable to remove the mask without assistance. °· The mask is meant to protect other people in case you are infected. °· Do NOT use a facemask meant for a healthcare worker. °· Continue to keep about 6 feet between yourself and others. The mask is not a substitute for social distancing. °Cover coughs and sneezes °· Always cover your mouth and nose with a tissue when you cough or sneeze or use the inside of your elbow. °· Throw used tissues in the trash. °· Immediately wash your hands with soap and water for at least 20 seconds. If soap and water are not readily available, clean your hands with a hand sanitizer that contains at least 60% alcohol. °Clean and disinfect °· Clean AND disinfect frequently touched surfaces daily. This   includes tables, doorknobs, light switches, countertops, handles, desks, phones, keyboards, toilets, faucets, and sinks. www.cdc.gov/coronavirus/2019-ncov/prevent-getting-sick/disinfecting-your-home.html °· If surfaces are dirty, clean them: Use detergent or soap and water prior to disinfection. °· Then, use a household disinfectant. You can see a list of EPA-registered household disinfectants here. °cdc.gov/coronavirus °06/21/2019 °This information is not intended to replace advice given to you by your health care provider. Make sure you discuss any questions you have with your health care provider. °Document Revised: 06/29/2019 Document Reviewed: 04/27/2019 °[employer]vier Patient Education © 2020 [employer] ° °

## 2020-04-15 NOTE — Discharge Summary (Signed)
[facility] NAME: Ardell Makarewicz    MR#:  [id number]  DATE OF BIRTH:  29-Oct-1922  DATE OF ADMISSION:  04/14/2020 ADMITTING PHYSICIAN: Athena Masse, MD  DATE OF DISCHARGE: 04/15/2020  PRIMARY CARE PHYSICIAN: Patient, No Pcp Per    ADMISSION DIAGNOSIS:  Generalized weakness [R53.1] Diarrhea, unspecified type [R19.7] Gastroenteritis due to COVID-19 virus [U07.1, A08.39] Pneumonia due to COVID-19 virus [U07.1, J12.82]  DISCHARGE DIAGNOSIS:  Principal Problem:   Gastroenteritis due to COVID-19 virus Active Problems:   HTN (hypertension)   Type 2 diabetes mellitus without complication (HCC)   AF (paroxysmal atrial fibrillation) (HCC)   BPH (benign prostatic hyperplasia)   Generalized weakness   Pancytopenia (HCC)   Recurrent skin cancer   Suspect early Pneumonia due to COVID-19 virus   SECONDARY DIAGNOSIS:   Past Medical History:  Diagnosis Date  . BPH (benign prostatic hyperplasia)   . Diabetes mellitus without complication ([place])   . Dyspnea    DOE  . GERD (gastroesophageal reflux disease)   . History of hiatal hernia   . HOH (hard of hearing)    AIDS  . Hypertension   . Skin cancer    COLON/ SKIN/MELANOMA    HOSPITAL COURSE:   1.  Gastroenteritis secondary to COVID-19 virus infection.  COVID-19 pneumonia.  Patient was started on remdesivir yesterday and receive day 2 of remdesivir.  Patient wants to be discharged from the hospital.  I offered outpatient remdesivir to complete 5 day course. The patient and family declined.  Patient's respiratory status is stable and patient is breathing comfortably on room air.  I did give 1 dose of Decadron.  Patient will go home on zinc and vitamin C and family will use over-the-counter medications for this. 2.  Generalized weakness.  Physical therapy recommended no physical therapy follow-up. 3.  Pancytopenia likely secondary to Covid infection. 4.  Essential hypertension hold  hydrochlorothiazide but continue other medications 5.  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  Hyperlipidemia unspecified on simvastatin 7.  Hypomagnesemia replace magnesium prior to disposition 8.  Paroxysmal atrial fibrillation that converted over to normal sinus rhythm likely because he was ill when he came in.  We will give IV magnesium.  Follow-up as outpatient.  DISCHARGE CONDITIONS:   Fair  CONSULTS OBTAINED:  None  DRUG ALLERGIES:  No Known Allergies  DISCHARGE MEDICATIONS:   Allergies as of 04/15/2020   No Known Allergies     Medication List    STOP taking these medications   hydrochlorothiazide 25 MG tablet Commonly known as: HYDRODIURIL     TAKE these medications   ascorbic acid 500 MG tablet Commonly known as: VITAMIN C Take 1 tablet (500 mg total) by mouth daily. Start taking on: April 16, 2020   finasteride 5 MG tablet Commonly known as: PROSCAR Take 5 mg by mouth daily.   fluorouracil 5 % cream Commonly known as: EFUDEX Apply once daily for 4 consecutive days each month.   lisinopril 40 MG tablet Commonly known as: ZESTRIL Take 40 mg by mouth.   metFORMIN 500 MG tablet Commonly known as: GLUCOPHAGE Take by mouth daily with breakfast.   simvastatin 80 MG tablet Commonly known as: ZOCOR Take 80 mg by mouth.   zinc sulfate 220 (50 Zn) MG capsule Take 1 capsule (220 mg total) by mouth daily. Start taking on: April 16, 2020  DISCHARGE INSTRUCTIONS:   Follow-up medical doctor at the [place] in 2 to 3 weeks  If you experience worsening of your admission symptoms, develop shortness of breath, life threatening emergency, suicidal or homicidal thoughts you must seek medical attention immediately by calling 911 or calling your MD immediately  if symptoms less severe.  You Must read complete instructions/literature along with all the possible adverse  reactions/side effects for all the Medicines you take and that have been prescribed to you. Take any new Medicines after you have completely understood and accept all the possible adverse reactions/side effects.   Please note  You were cared for by a hospitalist during your hospital stay. If you have any questions about your discharge medications or the care you received while you were in the hospital after you are discharged, you can call the unit and asked to speak with the hospitalist on call if the hospitalist that took care of you is not available. Once you are discharged, your primary care physician will handle any further medical issues. Please note that NO REFILLS for any discharge medications will be authorized once you are discharged, as it is imperative that you return to your primary care physician (or establish a relationship with a primary care physician if you do not have one) for your aftercare needs so that they can reassess your need for medications and monitor your lab values.    Today   CHIEF COMPLAINT:   Chief Complaint  Patient presents with  . Fatigue    HISTORY OF PRESENT ILLNESS:  Kodi Steil  is a 86 y.o. male came in with fatigue   VITAL SIGNS:  Blood pressure 131/62, pulse 62, temperature 97.8 F (36.6 C), temperature source Oral, resp. rate 18, height 5\' 9"  (1.753 m), weight 80 kg, SpO2 98 %.   PHYSICAL EXAMINATION:  GENERAL:  86 y.o.-year-old patient lying in the bed with no acute distress.  EYES: Pupils equal, round, reactive to light and accommodation. No scleral icterus. Extraocular muscles intact.  HEENT: Head atraumatic, normocephalic. Oropharynx and nasopharynx clear.  LUNGS: Normal breath sounds bilaterally, no wheezing, rales,rhonchi or crepitation. No use of accessory muscles of respiration.  CARDIOVASCULAR: S1, S2 normal. No murmurs, rubs, or gallops.  ABDOMEN: Soft, non-tender, non-distended.  EXTREMITIES: No pedal edema.  NEUROLOGIC:  Hearing impaired.  Otherwise moving all extremities on his own. PSYCHIATRIC: The patient is alert and oriented x 3.   DATA REVIEW:   CBC Recent Labs  Lab 04/15/20 0434  WBC 3.0*  HGB 11.6*  HCT 34.0*  PLT 125*    Chemistries  Recent Labs  Lab 04/15/20 0434  NA 141  K 3.7  CL 106  CO2 25  GLUCOSE 137*  BUN 30*  CREATININE 1.13  CALCIUM 7.8*  MG 1.6*  AST 20  ALT 13  ALKPHOS 44  BILITOT 0.6    Microbiology Results  Results for orders placed or performed during the hospital encounter of 04/14/20  SARS Coronavirus 2 by RT PCR (hospital order, performed in [facility] hospital lab) Nasopharyngeal Nasopharyngeal Swab     Status: Abnormal   Collection Time: 04/14/20  5:18 PM   Specimen: Nasopharyngeal Swab  Result Value Ref Range Status   SARS Coronavirus 2 POSITIVE (A) NEGATIVE Final    Comment: RESULT CALLED TO, READ BACK BY AND VERIFIED WITH: ROYCE BARHAM ON 04/14/20 AT 1826 [place] (NOTE) SARS-CoV-2 target nucleic acids are DETECTED  SARS-CoV-2 RNA is generally detectable in upper respiratory specimens  during the acute phase  of infection.  Positive results are indicative  of the presence of the identified virus, but do not rule out bacterial infection or co-infection with other pathogens not detected by the test.  Clinical correlation with patient history and  other diagnostic information is necessary to determine patient infection status.  The expected result is negative.  Fact Sheet for Patients:   StrictlyIdeas.no   Fact Sheet for Healthcare Providers:   BankingDealers.co.za    This test is not yet approved or cleared by the [place] FDA and  has been authorized for detection and/or diagnosis of SARS-CoV-2 by FDA under an Emergency Use Authorization (EUA).  This EUA will remain in effect (meaning this t est can be used) for the duration of  the COVID-19 declaration under Section 564(b)(1) of the Act,  21 U.S.C. section 360-bbb-3(b)(1), unless the authorization is terminated or revoked sooner.  Performed at [facility], [address], [place], [place] [address]     RADIOLOGY:  DG Chest Portable 1 View  Result Date: 04/14/2020 CLINICAL DATA:  Weakness. EXAM: PORTABLE CHEST 1 VIEW COMPARISON:  September 08, 2013 FINDINGS: The heart size is stable from prior study. Aortic calcifications are noted. There are hazy bibasilar airspace opacities. No pneumothorax. No significant pleural effusion. No definite acute osseous abnormality. IMPRESSION: Hazy bibasilar airspace opacities which may represent atelectasis or developing infiltrate. Electronically Signed   By: Constance Holster M.D.   On: 04/14/2020 18:42    Management plans discussed with the patient, family and they are in agreement.  CODE STATUS:     Code Status Orders  (From admission, onward)         Start     Ordered   04/14/20 1945  Full code  Continuous        04/14/20 1952        Code Status History    This patient has a current code status but no historical code status.   Advance Care Planning Activity      TOTAL TIME TAKING CARE OF THIS PATIENT: 35 minutes.    Loletha Grayer M.D on 04/15/2020 at 1:11 PM  Between 7am to 6pm - Pager - [phone]  After 6pm go to [email or web address] - password [email or web address]  [facility]  CC: Primary care physician; Kathalene Frames

## 2020-11-11 IMAGING — DX DG CHEST 1V PORT
1 series · 1 of 1 positions shown · non-contrast
Comparison: September 08, 2013

CLINICAL DATA: Weakness.

EXAM:
PORTABLE CHEST 1 VIEW

[chest ap]
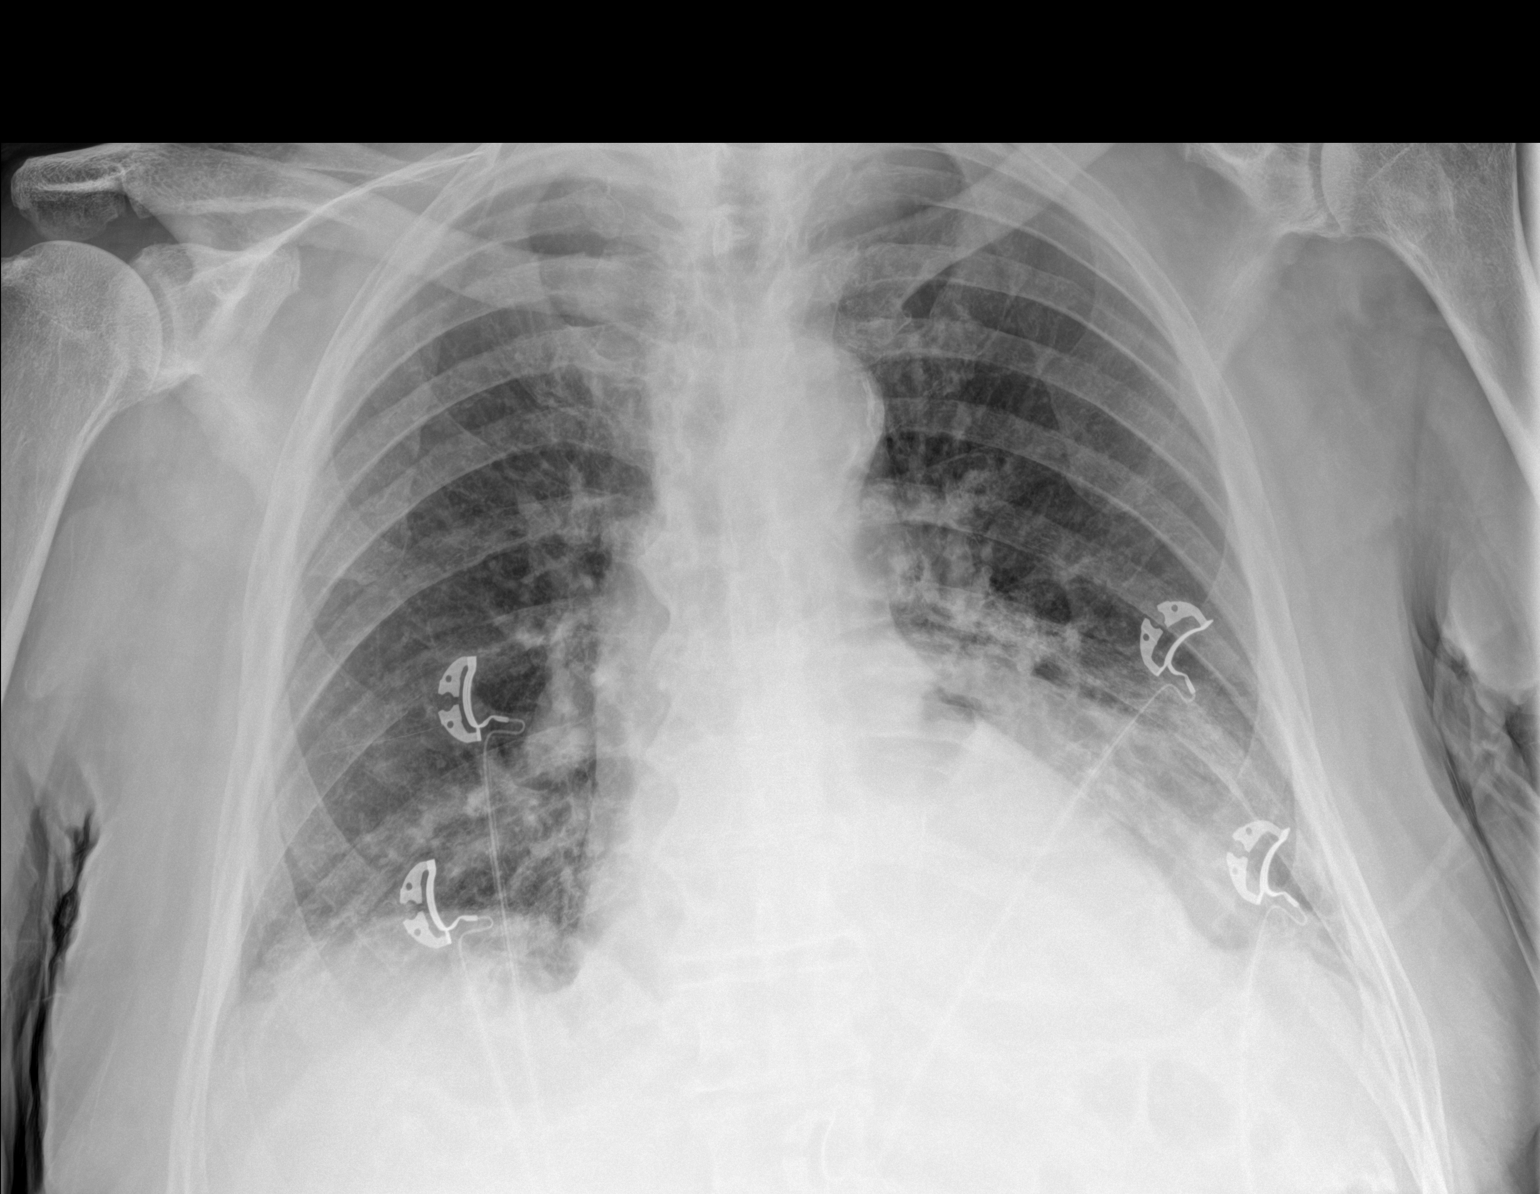

[1 of 1 positions shown; findings below may reference images not displayed]

FINDINGS: The heart size is stable from prior study. Aortic calcifications are
noted. There are hazy bibasilar airspace opacities. No pneumothorax.
No significant pleural effusion. No definite acute osseous
abnormality.
IMPRESSION: Hazy bibasilar airspace opacities which may represent atelectasis or
developing infiltrate.

## 2023-06-08 ENCOUNTER — Encounter: Payer: Self-pay | Admitting: Emergency Medicine

## 2023-06-08 ENCOUNTER — Emergency Department: Payer: Medicare Other

## 2023-06-08 ENCOUNTER — Other Ambulatory Visit: Payer: Self-pay

## 2023-06-08 ENCOUNTER — Inpatient Hospital Stay
Admission: EM | Admit: 2023-06-08 | Discharge: 2023-06-14 | DRG: 951 | Disposition: A | Discharge status: Hospice | Payer: Medicare Other | Attending: Internal Medicine | Admitting: Internal Medicine

## 2023-06-08 DIAGNOSIS — R54 Age-related physical debility: Secondary | ICD-10-CM | POA: Diagnosis present

## 2023-06-08 DIAGNOSIS — N1831 Chronic kidney disease, stage 3a: Secondary | ICD-10-CM | POA: Diagnosis present

## 2023-06-08 DIAGNOSIS — I10 Essential (primary) hypertension: Secondary | ICD-10-CM | POA: Diagnosis present

## 2023-06-08 DIAGNOSIS — Z9181 History of falling: Secondary | ICD-10-CM

## 2023-06-08 DIAGNOSIS — E1122 Type 2 diabetes mellitus with diabetic chronic kidney disease: Secondary | ICD-10-CM | POA: Diagnosis present

## 2023-06-08 DIAGNOSIS — Z79899 Other long term (current) drug therapy: Secondary | ICD-10-CM

## 2023-06-08 DIAGNOSIS — Z85828 Personal history of other malignant neoplasm of skin: Secondary | ICD-10-CM

## 2023-06-08 DIAGNOSIS — W19XXXA Unspecified fall, initial encounter: Secondary | ICD-10-CM

## 2023-06-08 DIAGNOSIS — Z9842 Cataract extraction status, left eye: Secondary | ICD-10-CM

## 2023-06-08 DIAGNOSIS — Z66 Do not resuscitate: Secondary | ICD-10-CM | POA: Diagnosis present

## 2023-06-08 DIAGNOSIS — Z87891 Personal history of nicotine dependence: Secondary | ICD-10-CM

## 2023-06-08 DIAGNOSIS — Z515 Encounter for palliative care: Principal | ICD-10-CM

## 2023-06-08 DIAGNOSIS — Z961 Presence of intraocular lens: Secondary | ICD-10-CM | POA: Diagnosis present

## 2023-06-08 DIAGNOSIS — R1310 Dysphagia, unspecified: Secondary | ICD-10-CM | POA: Diagnosis present

## 2023-06-08 DIAGNOSIS — K219 Gastro-esophageal reflux disease without esophagitis: Secondary | ICD-10-CM | POA: Diagnosis present

## 2023-06-08 DIAGNOSIS — D61818 Other pancytopenia: Secondary | ICD-10-CM | POA: Diagnosis present

## 2023-06-08 DIAGNOSIS — E785 Hyperlipidemia, unspecified: Secondary | ICD-10-CM | POA: Diagnosis present

## 2023-06-08 DIAGNOSIS — Z9841 Cataract extraction status, right eye: Secondary | ICD-10-CM

## 2023-06-08 DIAGNOSIS — Z6820 Body mass index (BMI) 20.0-20.9, adult: Secondary | ICD-10-CM

## 2023-06-08 DIAGNOSIS — Z7984 Long term (current) use of oral hypoglycemic drugs: Secondary | ICD-10-CM

## 2023-06-08 DIAGNOSIS — N400 Enlarged prostate without lower urinary tract symptoms: Secondary | ICD-10-CM | POA: Diagnosis present

## 2023-06-08 DIAGNOSIS — R296 Repeated falls: Secondary | ICD-10-CM | POA: Diagnosis present

## 2023-06-08 DIAGNOSIS — Z85820 Personal history of malignant melanoma of skin: Secondary | ICD-10-CM

## 2023-06-08 DIAGNOSIS — Y92009 Unspecified place in unspecified non-institutional (private) residence as the place of occurrence of the external cause: Secondary | ICD-10-CM

## 2023-06-08 DIAGNOSIS — R627 Adult failure to thrive: Secondary | ICD-10-CM | POA: Diagnosis not present

## 2023-06-08 DIAGNOSIS — I129 Hypertensive chronic kidney disease with stage 1 through stage 4 chronic kidney disease, or unspecified chronic kidney disease: Secondary | ICD-10-CM | POA: Diagnosis present

## 2023-06-08 DIAGNOSIS — I480 Paroxysmal atrial fibrillation: Secondary | ICD-10-CM | POA: Diagnosis present

## 2023-06-08 DIAGNOSIS — R634 Abnormal weight loss: Secondary | ICD-10-CM | POA: Diagnosis present

## 2023-06-08 LAB — CBC WITH DIFFERENTIAL/PLATELET
Abs Immature Granulocytes: 0.06 10*3/uL (ref 0.00–0.07)
Basophils Absolute: 0 10*3/uL (ref 0.0–0.1)
Basophils Relative: 0 %
Eosinophils Absolute: 0 10*3/uL (ref 0.0–0.5)
Eosinophils Relative: 0 %
HCT: 39.7 % (ref 39.0–52.0)
Hemoglobin: 12.9 g/dL — ABNORMAL LOW (ref 13.0–17.0)
Immature Granulocytes: 1 %
Lymphocytes Relative: 8 %
Lymphs Abs: 1 10*3/uL (ref 0.7–4.0)
MCH: 30.2 pg (ref 26.0–34.0)
MCHC: 32.5 g/dL (ref 30.0–36.0)
MCV: 93 fL (ref 80.0–100.0)
Monocytes Absolute: 0.6 10*3/uL (ref 0.1–1.0)
Monocytes Relative: 5 %
Neutro Abs: 11.2 10*3/uL — ABNORMAL HIGH (ref 1.7–7.7)
Neutrophils Relative %: 86 %
Platelets: 233 10*3/uL (ref 150–400)
RBC: 4.27 MIL/uL (ref 4.22–5.81)
RDW: 15.5 % (ref 11.5–15.5)
WBC: 12.8 10*3/uL — ABNORMAL HIGH (ref 4.0–10.5)
nRBC: 0 % (ref 0.0–0.2)

## 2023-06-08 LAB — COMPREHENSIVE METABOLIC PANEL
ALT: 17 U/L (ref 0–44)
AST: 27 U/L (ref 15–41)
Albumin: 3.3 g/dL — ABNORMAL LOW (ref 3.5–5.0)
Alkaline Phosphatase: 45 U/L (ref 38–126)
Anion gap: 12 (ref 5–15)
BUN: 20 mg/dL (ref 8–23)
CO2: 21 mmol/L — ABNORMAL LOW (ref 22–32)
Calcium: 8.4 mg/dL — ABNORMAL LOW (ref 8.9–10.3)
Chloride: 108 mmol/L (ref 98–111)
Creatinine, Ser: 1.2 mg/dL (ref 0.61–1.24)
GFR, Estimated: 55 mL/min — ABNORMAL LOW (ref >60)
Glucose, Bld: 145 mg/dL — ABNORMAL HIGH (ref 70–99)
Potassium: 3.8 mmol/L (ref 3.5–5.1)
Sodium: 141 mmol/L (ref 135–145)
Total Bilirubin: 1.7 mg/dL — ABNORMAL HIGH (ref 0.3–1.2)
Total Protein: 5.9 g/dL — ABNORMAL LOW (ref 6.5–8.1)

## 2023-06-08 MED ORDER — HALOPERIDOL LACTATE 2 MG/ML PO CONC: 0.5000 mg | ORAL | Status: DC | PRN

## 2023-06-08 MED ORDER — ONDANSETRON 4 MG PO TBDP: 4.0000 mg | ORAL_TABLET | Freq: Four times a day (QID) | ORAL | Status: DC | PRN

## 2023-06-08 MED ORDER — RISPERIDONE 0.5 MG PO TABS
1.0000 mg | ORAL_TABLET | Freq: Once | ORAL | Status: DC
  Filled 2023-06-08: qty 1

## 2023-06-08 MED ORDER — SODIUM CHLORIDE 0.9% FLUSH: 3.0000 mL | INTRAVENOUS | Status: DC | PRN

## 2023-06-08 MED ORDER — BIOTENE DRY MOUTH MT LIQD
15.0000 mL | OROMUCOSAL | Status: DC | PRN
  Filled 2023-06-08: qty 15

## 2023-06-08 MED ORDER — GLYCOPYRROLATE 0.2 MG/ML IJ SOLN: 0.2000 mg | INTRAMUSCULAR | Status: DC | PRN

## 2023-06-08 MED ORDER — MELATONIN 5 MG PO TABS
2.5000 mg | ORAL_TABLET | Freq: Every day | ORAL | Status: DC
  Filled 2023-06-08 (×4): qty 1

## 2023-06-08 MED ORDER — POLYVINYL ALCOHOL 1.4 % OP SOLN: 1.0000 [drp] | Freq: Four times a day (QID) | OPHTHALMIC | Status: DC | PRN

## 2023-06-08 MED ORDER — ACETAMINOPHEN 650 MG RE SUPP: 650.0000 mg | Freq: Four times a day (QID) | RECTAL | Status: DC | PRN

## 2023-06-08 MED ORDER — HALOPERIDOL LACTATE 5 MG/ML IJ SOLN: 0.5000 mg | INTRAMUSCULAR | Status: DC | PRN

## 2023-06-08 MED ORDER — GLYCOPYRROLATE 1 MG PO TABS: 1.0000 mg | ORAL_TABLET | ORAL | Status: DC | PRN

## 2023-06-08 MED ORDER — SODIUM CHLORIDE 0.9 % IV SOLN: 250.0000 mL | INTRAVENOUS | Status: DC | PRN

## 2023-06-08 MED ORDER — SODIUM CHLORIDE 0.9% FLUSH: 3.0000 mL | Freq: Two times a day (BID) | INTRAVENOUS | Status: DC

## 2023-06-08 MED ORDER — ACETAMINOPHEN 325 MG PO TABS: 650.0000 mg | ORAL_TABLET | Freq: Four times a day (QID) | ORAL | Status: DC | PRN

## 2023-06-08 MED ORDER — HALOPERIDOL 0.5 MG PO TABS: 0.5000 mg | ORAL_TABLET | ORAL | Status: DC | PRN

## 2023-06-08 MED ORDER — ONDANSETRON HCL 4 MG/2ML IJ SOLN: 4.0000 mg | Freq: Four times a day (QID) | INTRAMUSCULAR | Status: DC | PRN

## 2023-06-08 NOTE — ED Provider Notes (Signed)
Consulted and discussed patient's case with hospitalist, Dr. Allena Katz.  I have recommended admission and consulting physician agrees and will place admission orders.  Patient (and family if present) agree with this plan.   I reviewed all nursing notes, vitals, pertinent previous records.  All labs, EKGs, imaging ordered have been independently reviewed and interpreted by myself.   Patient admitted for comfort care measures and to see palliative care in the AM.   Jovaun Levene, Layla Maw, DO 06/08/23 2329

## 2023-06-08 NOTE — Progress Notes (Addendum)
[facility]- Civil [facility], contracting Hospice Liaison Note:  Patient was scheduled for his initial nursing visit from hospice nurse today at 1pm; however,  patient was brought to the ED this morning via EMS due to a fall and decline in condition.  Son wanted patient evaluated for the inpatient unit.  Patient is not appropriate for the inpatient unit at this time.  Son stated that they could not take patient home due to his care needs.  HL spoke with son about Hospice follow up in a LTC facility.  TOC, ED Attending aware of the above noted information.    Please don't hesitate to call with any Hospice related questions or concerns.    Thank you for the opportunity to participate in this patient's care. [facility] Liaison [phone]

## 2023-06-08 NOTE — Assessment & Plan Note (Signed)
No treatment

## 2023-06-08 NOTE — TOC Initial Note (Addendum)
Transition of Care [facility]) - Initial/Assessment Note    Patient Details  Name: Jeffery Avila MRN: [id number] Date of Birth: 05/01/1923  [facility]) CM/SW Contact:    Kreg Shropshire, RN Phone Number: 06/08/2023, 11:29 AM  Clinical Narrative:                  Cm received consult for pt regarding inpt hospice facility. Cm called pt son and daughter in law. They stated that [facility] was suppose to meet with them at 1pm. The family still wants to work with [facility] and hopefully have pt go to inpt hospice. Cm will reach out to [facility] with Hospice for f/u.  1409-received message from [facility] with Hospice and pt does not meet inpt hospice at this time. Cm called daughter and ask about plan for taking care of pt. Cm explained that [facility] Medicare does not have for long term care. She stated family is willing to pay for private pay. Pt agreed to get contact information for [employer] for assistance.   1419-Cm made outreach to Carmelia Bake, Senior Care Consultant with [facility] [phone] for help with placement needs for pt and family. Cm will need more information regarding incontinent/continent, wounds, and ambulatory status       Patient Goals and CMS Choice   CMS Medicare.gov Compare Post Acute Care list provided to:: Patient Represenative (must comment) Choice offered to / list presented to : Adult Children      Expected Discharge Plan and Services     Post Acute Care Choice: Hospice Living arrangements for the past 2 months: Single Family Home                                      Prior Living Arrangements/Services Living arrangements for the past 2 months: Single Family Home Lives with:: Self          Need for Family Participation in Patient Care: Yes (Comment) Care giver support system in place?: Yes (comment)      Activities of Daily Living      Permission Sought/Granted                  Emotional  Assessment Appearance:: Appears stated age            Admission diagnosis:  Fall Patient Active Problem List   Diagnosis Date Noted   Hypomagnesemia    HTN (hypertension) 04/14/2020   Type 2 diabetes mellitus with stage 3a chronic kidney disease, without long-term current use of insulin (HCC) 04/14/2020   AF (paroxysmal atrial fibrillation) (HCC) 04/14/2020   Gastroenteritis due to COVID-19 virus 04/14/2020   BPH (benign prostatic hyperplasia) 04/14/2020   Generalized weakness 04/14/2020   Pancytopenia (HCC) 04/14/2020   Recurrent skin cancer 04/14/2020   Suspect early Pneumonia due to COVID-19 virus 04/14/2020   PCP:  Patient, No Pcp Per Pharmacy:   [facility] [address] [place], [place] - [address] [place] [address] Phone: [phone] Fax: [phone]     Social Determinants of Health (SDOH) Social History: SDOH Screenings   Tobacco Use: Medium Risk (06/08/2023)   SDOH Interventions:     Readmission Risk Interventions     No data to display

## 2023-06-08 NOTE — ED Notes (Signed)
Patient and family member refused patient to be cathed for urine specimen. EDP made aware.

## 2023-06-08 NOTE — ED Provider Notes (Addendum)
Care assumed of patient from outgoing provider.  See their note for initial history, exam and plan.  Clinical Course as of 06/08/23 1740  Tue Jun 08, 2023  1505 Hospice patient - increased generalized weakness with FTT and multiple falls.  Home hospice evaluation today. Does not qualify for inpatient hospice.  Follow up CT scans. Likely will be a social work eval and placement.  [SM]    Clinical Course User Index [SM] Corena Herter, MD   CT scan of the chest with pleural effusion and large hiatal hernia.  Discussed these findings with family members at bedside.  Patient is bradycardic which appears to be new for him but is normotensive.  Confirmed that the patient only wants comfort care measures.  Reached again back out to [facility] with hospice -does not meet criteria for inpatient hospice at this time and will need placement.  Patient was made a border for social work, PT evaluation.  Family updated in patient's care.   Corena Herter, MD 06/08/23 1714    Corena Herter, MD 06/08/23 1742

## 2023-06-08 NOTE — Assessment & Plan Note (Signed)
Son and patient wish for comfort measures and we will admit to med surg and request palliative / hospice consult in am. Will order comfort measure order set.

## 2023-06-08 NOTE — ED Provider Notes (Signed)
[facility] Provider Note    Event Date/Time   First MD Initiated Contact with Patient 06/08/23 0900     (approximate)   History   Fall and Decreased Appetite   HPI  Jeffery Avila is a 89 y.o. male with a history of hypertension, diabetes, paroxysmal atrial fibrillation, BPH, and pancytopenia who presents with increased generalized weakness and multiple falls.  He has also had decreased appetite.  Per the patient's son, he has become increasingly weak over the last few weeks causing him to fall multiple times.  He hit his head, his left arm, as well as his right foot.  He was found down on the floor this morning.  They have also noted that his heart rate has been as low as the high 30s over the last several days.  The patient himself has no complaints.  He denies any headache, neck pain, or other acute pain.  I reviewed the past medical records.  The patient's most recent inpatient admission was to the hospitalist service in 2021 with COVID and generalized weakness.   Physical Exam   Triage Vital Signs: ED Triage Vitals [06/08/23 0902]  Encounter Vitals Group     BP (!) 168/69     Systolic BP Percentile      Diastolic BP Percentile      Pulse Rate (!) 42     Resp 18     Temp 98 F (36.7 C)     Temp src      SpO2 97 %     Weight      Height      Head Circumference      Peak Flow      Pain Score      Pain Loc      Pain Education      Exclude from Growth Chart     Most recent vital signs: Vitals:   06/08/23 1500 06/08/23 1530  BP: (!) 128/43 (!) 146/59  Pulse: (!) 32 (!) 57  Resp: 18 20  Temp:    SpO2: 96% 95%     General: Awake, no distress.  CV:  Good peripheral perfusion.  Resp:  Normal effort.  Abd:  No distention.  Other:  EOMI.  PERRLA.  No facial droop.  Normal speech.  Motor intact in all extremities.  Left elbow skin tear.  Mild tenderness to the dorsum of the right foot.  Intact pulses to all extremities.  Slightly dry  mucous membranes.   ED Results / Procedures / Treatments   Labs (all labs ordered are listed, but only abnormal results are displayed) Labs Reviewed  CBC WITH DIFFERENTIAL/PLATELET - Abnormal; Notable for the following components:      Result Value   WBC 12.8 (*)    Hemoglobin 12.9 (*)    Neutro Abs 11.2 (*)    All other components within normal limits  COMPREHENSIVE METABOLIC PANEL - Abnormal; Notable for the following components:   CO2 21 (*)    Glucose, Bld 145 (*)    Calcium 8.4 (*)    Total Protein 5.9 (*)    Albumin 3.3 (*)    Total Bilirubin 1.7 (*)    GFR, Estimated 55 (*)    All other components within normal limits  URINALYSIS, ROUTINE W REFLEX MICROSCOPIC     EKG  ED ECG REPORT I, Dionne Bucy, the attending physician, personally viewed and interpreted this ECG.  Date: 06/08/2023 EKG Time: 0909 Rate: Approximately 40 Rhythm:  Atrial fibrillation, bradycardic QRS Axis: normal Intervals: Nonspecific IVCD ST/T Wave abnormalities: Nonspecific ST abnormalities Narrative Interpretation: no evidence of acute ischemia    RADIOLOGY  XR R foot: I independently viewed and interpreted the images; no acute fracture  XR L forearm: No acute fracture  CT head: No ICH  CT abdomen/pelvis: Pending  PROCEDURES:  Critical Care performed: No  Procedures   MEDICATIONS ORDERED IN ED: Medications - No data to display   IMPRESSION / MDM / ASSESSMENT AND PLAN / ED COURSE  I reviewed the triage vital signs and the nursing notes.  89 year old male with PMH as noted above presents with increased generalized weakness, decreased appetite, and multiple falls.  The family member state they can no longer take care of him at home.  He has a hospice evaluation at home scheduled for this afternoon.  The patient denies any acute complaints.  On exam he is bradycardic with otherwise normal vital signs.  Neurologic exam is nonfocal.  Differential diagnosis includes, but is  not limited to, dehydration, electrolyte abnormality, other metabolic disturbance, anemia, UTI, other infection, symptomatic bradycardia, less likely other cardiac cause.  We will obtain CT head, x-rays of the affected areas, labs, urinalysis to evaluate for reversible etiologies.  I have ordered TOC consultation to evaluate for hospice placement.  Patient's presentation is most consistent with acute complicated illness / injury requiring diagnostic workup.  The patient is on the cardiac monitor to evaluate for evidence of arrhythmia and/or significant heart rate changes.  ----------------------------------------- 3:51 PM on 06/08/2023 -----------------------------------------  The patient remains bradycardic but is otherwise resting comfortably.  CMP shows no acute abnormalities.  CBC shows mild leukocytosis.  The patient has not yet given a urine sample and the family declined catheterization.  CT head and initial x-ray showed no acute findings.  Per the son, the patient has a history of possible abdominal cancer.  I added on a CT abdomen/pelvis to evaluate for any progression related to this.  I discussed the case with the case manager for hospice who advises that the patient currently does not qualify for inpatient hospice placement although this may change depending on the results of the workup.  The patient and family want to move to comfort care and do not want any treatment even for reversible causes of his recent decline.  If he does not qualify for inpatient hospice he will need to go to a SNF.  They are aware that this can take several days or longer from the ED.  There is no indication for inpatient admission at this time.  The plan will be TOC disposition either to hospice or SNF depending on the results of the rest of the workup.  I have signed the patient out to the oncoming ED physician.   FINAL CLINICAL IMPRESSION(S) / ED DIAGNOSES   Final diagnoses:  Failure to thrive in adult      Rx / DC Orders   ED Discharge Orders     None        Note:  This document was prepared using Dragon voice recognition software and may include unintentional dictation errors.    Dionne Bucy, MD 06/08/23 [phone]

## 2023-06-08 NOTE — ED Notes (Signed)
Patient transported to CT 

## 2023-06-08 NOTE — H&P (Signed)
History and Physical    Patient: Jeffery Avila XBJ:[id number] DOB: Apr 26, 1923 DOA: 06/08/2023 DOS: the patient was seen and examined on 06/08/2023 PCP: Patient, No Pcp Per  Patient coming from: Home   Chief Complaint:  Chief Complaint  Patient presents with   Fall   Decreased Appetite    HPI: Jeffery Avila is a 89 y.o. male with medical history significant for hypertensionGERD, multiple skin cancers presenting with falls from home. Comfort care only. DNR /DNI falls over past days.  Pt wants to go to hospice house.  Called Jeffery Jeffery Avila who says he is it for [employer].  Per Jeffery  "dad needs hearing aids.  He has been falling x past few weeks.  He can't stand up and needs assistance.  He lives by himself and can't any more.  Pt had wedged himself in the bed and night stand. He needs assistance complete care.  He has lost weight - 75 pounds.  Heart rate is low.  Pt is frail. Today am Jeffery found him and is not capable of taking care of himself.  Pt has home health and home hospice.  Jeffery wants hospice.  Jeffery states he does not have any hospice services currently.  Jeffery does not want him to be treatment.  Jeffery wants me to make him comfort measures. Jeffery wants Comfort measures only per pt wishes And to call him if any questions if any ok with Jeffery: Jeffery Avila [phone]. D/W Jeffery that his prognosis is guarded and terminal . " He understands that his HR is low and may cause arrhythmia / asystole.  Jeffery wants wound care. And provide sips as needed if pt wants .  I have discussed with Jeffery and final plan of comfort measures and understands that dad may pass tonight and he says " we will rejoice " if dad passes and follow his wishes of comfort care.   Review of Systems: Review of Systems  Unable to perform ROS: Age    Past Medical History:  Diagnosis Date   BPH (benign prostatic hyperplasia)    Diabetes mellitus without complication (HCC)    Dyspnea    DOE   GERD  (gastroesophageal reflux disease)    History of hiatal hernia    HOH (hard of hearing)    AIDS   Hypertension    Skin cancer    COLON/ SKIN/MELANOMA   Past Surgical History:  Procedure Laterality Date   APPENDECTOMY     CATARACT EXTRACTION W/PHACO Right 06/07/2018   Procedure: CATARACT EXTRACTION PHACO AND INTRAOCULAR LENS PLACEMENT (IOC) suture placed in right eye at end of procedure;  Surgeon: Galen Manila, MD;  Location: [facility] ORS;  Service: Ophthalmology;  Laterality: Right;  [place] 00:56.5 AP% 15.6 CDE 8.81 Fluid Pack Lot # [id number]   CATARACT EXTRACTION W/PHACO Left 06/28/2018   Procedure: CATARACT EXTRACTION PHACO AND INTRAOCULAR LENS PLACEMENT (IOC);  Surgeon: Galen Manila, MD;  Location: [facility] ORS;  Service: Ophthalmology;  Laterality: Left;  [place]  00:53 AP% 14.4 CDE 7.72 Fluid pack lot # [id number]   SKIN CANCER EXCISION     Social History:   reports that he has quit smoking. He has never used smokeless tobacco. He reports that he does not currently use alcohol. No history on file for drug use.  No Known Allergies  History reviewed. No pertinent family history.  Prior to Admission medications   Medication Sig Start Date End Date Taking? Authorizing Provider  ascorbic acid (VITAMIN C) 500  MG tablet Take 1 tablet (500 mg total) by mouth daily. 04/16/20  Yes Wieting, Richard, MD  finasteride (PROSCAR) 5 MG tablet Take 5 mg by mouth daily.   Yes [provider]  lisinopril (PRINIVIL,ZESTRIL) 40 MG tablet Take 40 mg by mouth.   Yes [provider]  metFORMIN (GLUCOPHAGE) 500 MG tablet Take by mouth daily with breakfast.   Yes [provider]  simvastatin (ZOCOR) 80 MG tablet Take 80 mg by mouth.   Yes [provider]  zinc sulfate 220 (50 Zn) MG capsule Take 1 capsule (220 mg total) by mouth daily. 04/16/20  Yes Wieting, Richard, MD  fluorouracil (EFUDEX) 5 % cream Apply once daily for 4 consecutive days each month. 02/15/17   [provider]     Vitals:   06/08/23 1530 06/08/23 1600 06/08/23 1630 06/08/23 1700  BP: (!) 146/59 (!) 160/55 (!) 139/49 (!) 141/59  Pulse: (!) 57 (!) 43 (!) 32 (!) 35  Resp: 20 (!) 21 20 15   Temp:      TempSrc:      SpO2: 95% 98% 96% 95%  Weight:      Height:       Physical Exam Constitutional:      General: He is not in acute distress.    Appearance: He is ill-appearing.  Cardiovascular:     Rate and Rhythm: Normal rate.     Heart sounds: Normal heart sounds.  Pulmonary:     Effort: Pulmonary effort is normal.     Breath sounds: Normal breath sounds.  Abdominal:     Palpations: Abdomen is soft.  Musculoskeletal:     Right lower leg: No edema.     Left lower leg: No edema.  Neurological:     Mental Status: He is alert.     Motor: Weakness present.      Labs on Admission: I have personally reviewed following labs and imaging studies  CBC: Recent Labs  Lab 06/08/23 0911  WBC 12.8*  NEUTROABS 11.2*  HGB 12.9*  HCT 39.7  MCV 93.0  PLT 233   Basic Metabolic Panel: Recent Labs  Lab 06/08/23 1124  NA 141  K 3.8  CL 108  CO2 21*  GLUCOSE 145*  BUN 20  CREATININE 1.20  CALCIUM 8.4*   GFR: Estimated Creatinine Clearance: 31.6 mL/min (by C-G formula based on SCr of 1.2 mg/dL). Liver Function Tests: Recent Labs  Lab 06/08/23 1124  AST 27  ALT 17  ALKPHOS 45  BILITOT 1.7*  PROT 5.9*  ALBUMIN 3.3*   No results for input(s): "LIPASE", "AMYLASE" in the last 168 hours. No results for input(s): "AMMONIA" in the last 168 hours. Coagulation Profile: No results for input(s): "INR", "PROTIME" in the last 168 hours. Cardiac Enzymes: No results for input(s): "CKTOTAL", "CKMB", "CKMBINDEX", "TROPONINI" in the last 168 hours. BNP (last 3 results) No results for input(s): "PROBNP" in the last 8760 hours. HbA1C: No results for input(s): "HGBA1C" in the last 72 hours. CBG: No results for input(s): "GLUCAP" in the last 168 hours. Lipid Profile: No results  for input(s): "CHOL", "HDL", "LDLCALC", "TRIG", "CHOLHDL", "LDLDIRECT" in the last 72 hours. Thyroid Function Tests: No results for input(s): "TSH", "T4TOTAL", "FREET4", "T3FREE", "THYROIDAB" in the last 72 hours. Anemia Panel: No results for input(s): "VITAMINB12", "FOLATE", "FERRITIN", "TIBC", "IRON", "RETICCTPCT" in the last 72 hours. Urinalysis    Component Value Date/Time   COLORURINE Yellow 09/08/2013 0933   APPEARANCEUR Clear 09/08/2013 0933   LABSPEC 1.018  09/08/2013 0933   PHURINE 6.0 09/08/2013 0933   GLUCOSEU Negative 09/08/2013 0933   HGBUR Negative 09/08/2013 0933   BILIRUBINUR Negative 09/08/2013 0933   KETONESUR Negative 09/08/2013 0933   PROTEINUR Negative 09/08/2013 0933   NITRITE Negative 09/08/2013 0933   LEUKOCYTESUR Negative 09/08/2013 0933      Unresulted Labs (From admission, onward)     Start     Ordered   06/08/23 0913  Urinalysis, Routine w reflex microscopic -Urine, Clean Catch  Once,   URGENT       Question:  Specimen Source  Answer:  Urine, Clean Catch   06/08/23 0913            Medications  melatonin tablet 2.5 mg (2.5 mg Oral Not Given 06/08/23 2132)  risperiDONE (RISPERDAL) tablet 1 mg (1 mg Oral Not Given 06/08/23 2132)    Radiological Exams on Admission: CT ABDOMEN PELVIS WO CONTRAST  Result Date: 06/08/2023 CLINICAL DATA:  Acute, non localized abdominal pain. Multiple falls over the past few days. Decreased appetite over the past week. EXAM: CT ABDOMEN AND PELVIS WITHOUT CONTRAST TECHNIQUE: Multidetector CT imaging of the abdomen and pelvis was performed following the standard protocol without IV contrast. RADIATION DOSE REDUCTION: This exam was performed according to the departmental dose-optimization program which includes automated exposure control, adjustment of the mA and/or kV according to patient size and/or use of iterative reconstruction technique. COMPARISON:  09/08/2013. FINDINGS: Lower chest: Moderate-sized right pleural  effusion and smaller left pleural effusion. Adjacent bilateral compressive atelectasis, left greater than right. A huge hiatal hernia is again demonstrated, this time containing stomach, colon and a portion of the pancreas. Hepatobiliary: Dilated gallbladder containing multiple tiny dependent calculi. No gallbladder wall thickening or pericholecystic fluid. Small calcified granuloma in the liver. Pancreas: Mildly atrophied. Spleen: Normal in size without focal abnormality. Adrenals/Urinary Tract: Large right renal cyst and smaller cyst with a mild increase in size. These both continued have appearances compatible with simple cysts and do not need imaging follow-up. Unremarkable adrenal glands, ureters and urinary bladder. 2 mm left renal calculus. Stomach/Bowel: The entire stomach is in the large hiatal hernia as well as a portion of the splenic flexure, distal transverse colon and proximal descending colon. Multiple descending and sigmoid colon diverticula without evidence of diverticulitis. Surgically absent appendix. Unremarkable small bowel. Vascular/Lymphatic: Atheromatous arterial calcifications without aneurysm. No enlarged lymph nodes. Reproductive: Coarse calcifications in a normal-sized prostate gland. Other: Small bilateral inguinal hernias containing fat. Diffuse subcutaneous edema. No free peritoneal fluid. Musculoskeletal: Lumbar and lower thoracic spine degenerative changes. IMPRESSION: 1. Moderate-sized right pleural effusion and smaller left pleural effusion with adjacent bilateral compressive atelectasis, left greater than right. 2. Huge hiatal containing the entire stomach and portions of the pancreas and colon. 3. Cholelithiasis with a dilated gallbladder. No gallbladder wall thickening or pericholecystic fluid. 4. 2 mm nonobstructing left renal calculus. 5. Diffuse subcutaneous edema. Electronically Signed   By: Beckie Salts M.D.   On: 06/08/2023 16:05   DG Chest Port 1 View  Result Date:  06/08/2023 CLINICAL DATA:  Weakness EXAM: PORTABLE CHEST 1 VIEW COMPARISON:  Chest x-ray 04/14/2020 FINDINGS: Large hiatal hernia is present. Small bilateral pleural effusions and minimal bibasilar opacities are present. The cardiomediastinal silhouette is grossly within normal limits. There is no pneumothorax or acute fracture. IMPRESSION: 1. Small bilateral pleural effusions and minimal bibasilar opacities, likely atelectasis. 2. Large hiatal hernia. Electronically Signed   By: Darliss Cheney M.D.   On: 06/08/2023 15:16   CT  Head Wo Contrast  Result Date: 06/08/2023 CLINICAL DATA:  89 year old male with multiple recent falls. Decreased p.o. Found down. EXAM: CT HEAD WITHOUT CONTRAST TECHNIQUE: Contiguous axial images were obtained from the base of the skull through the vertex without intravenous contrast. RADIATION DOSE REDUCTION: This exam was performed according to the departmental dose-optimization program which includes automated exposure control, adjustment of the mA and/or kV according to patient size and/or use of iterative reconstruction technique. COMPARISON:  Head CT 06/23/2018. FINDINGS: Brain: Cerebral volume is within normal limits for age. No midline shift, ventriculomegaly, mass effect, evidence of mass lesion, intracranial hemorrhage or evidence of cortically based acute infarction. Chronic asymmetric left mesial temporal lobe volume loss is stable and nonspecific (coronal image 35). No discrete cortical encephalomalacia. And gray-white differentiation remains within normal limits for age. Vascular: No suspicious intracranial vascular hyperdensity. Calcified atherosclerosis at the skull base. Skull: No acute osseous abnormality identified. Sinuses/Orbits: Stable paranasal sinus aeration with scattered mild mucosal thickening and/or mucous retention cysts. New polypoid opacity in the posterior right nasal cavity is up to 2.3 cm on series 3, image 7. Tympanic cavities and mastoids remain clear.  Other: No orbit or scalp soft tissue injury identified. IMPRESSION: 1. No acute intracranial abnormality or traumatic injury identified. 2. New 2.3 cm polypoid opacity in the posterior right nasal cavity since 2019. This might be retained secretions but a right nasal polyp is not excluded. Electronically Signed   By: Odessa Fleming M.D.   On: 06/08/2023 10:42   DG Foot 2 Views Right  Result Date: 06/08/2023 CLINICAL DATA:  Right foot pain after fall. EXAM: RIGHT FOOT - 2 VIEW COMPARISON:  None Available. FINDINGS: There is no evidence of fracture or dislocation. Mild degenerative change for age. Sclerotic density in the great toe distal phalanx has a nonaggressive appearance. Slight hammertoe deformity of the toes. Incidental os peroneal. Plantar calcaneal spur and Achilles tendon enthesophyte. Mild soft tissue edema. IMPRESSION: 1. No acute fracture or dislocation of the right foot. 2. Mild degenerative change for age. Electronically Signed   By: Narda Rutherford M.D.   On: 06/08/2023 10:37   DG Forearm Left  Result Date: 06/08/2023 CLINICAL DATA:  Fall with laceration. EXAM: LEFT FOREARM - 2 VIEW COMPARISON:  None Available. FINDINGS: No fracture of the radius or ulna. Moderate to advanced chronic degenerative change of the wrist. No wrist or elbow dislocation. Punctate density in the soft tissues adjacent to the volar mid ulna may be a chronic calcification or tiny foreign body. No soft tissue gas. Mild generalized soft tissue edema. IMPRESSION: 1. No fracture of the radius or ulna. 2. Punctate density in the soft tissues adjacent to the volar mid ulna may be a chronic calcification or tiny foreign body. 3. Chronic wrist arthropathy. Electronically Signed   By: Narda Rutherford M.D.   On: 06/08/2023 10:36     Data Reviewed: Relevant notes from primary care and specialist visits, past discharge summaries as available in EHR, including Care Everywhere. Prior diagnostic testing as pertinent to current  admission diagnoses Updated medications and problem lists for reconciliation ED course, including vitals, labs, imaging, treatment and response to treatment Triage notes, nursing and pharmacy notes and ED provider's notes Notable results as noted in HPI  Assessment and Plan: * Comfort measures only status Jeffery and patient wish for comfort measures and we will admit to med surg and request palliative / hospice consult in am. Will order comfort measure order set.    Type 2 diabetes  mellitus with stage 3a chronic kidney disease, without long-term current use of insulin (HCC) No treatment.   HTN (hypertension) No treatment.    Prognosis: Terminal   DVT prophylaxis:  None   Consults:  Palliative care  Hospice  Wound care.   Advance Care Planning:    Code Status: DNR   Family Communication:  Jeffery Avila: [phone]  Disposition Plan:  Hospice.   Severity of Illness: The appropriate patient status for this patient is OBSERVATION. Observation status is judged to be reasonable and necessary in order to provide the required intensity of service to ensure the patient's safety. The patient's presenting symptoms, physical exam findings, and initial radiographic and laboratory data in the context of their medical condition is felt to place them at decreased risk for further clinical deterioration. Furthermore, it is anticipated that the patient will be medically stable for discharge from the hospital within 2 midnights of admission.   Author: Gertha Calkin, MD 06/08/2023 11:46 PM  For on call review [email or web address].

## 2023-06-08 NOTE — ED Notes (Signed)
ED Provider at bedside. 

## 2023-06-08 NOTE — ED Notes (Signed)
Patient placed on bedpan with no urine sample provided. Attempted to use urinal as well.

## 2023-06-08 NOTE — ED Triage Notes (Addendum)
Per [facility] pt coming from home for multiple falls over the past few days. Reports decreased appetite over past week. 20G LAC. Patient remembers hitting head when falling. Son states he found the patient wedged between dresser and floor this am. Had last checked on patient around 10pm last night. Denies any neck pain. Has skin tear to left wrist and left elbow. State yesterday found patient in restroom with stool all over him and that he had hurt his left foot.   Son to bedside adding that patient has been having decline over the past couple months and losing a lot of weight. States hospice was supposed to be evaluating patient at 1pm today. Patients family at bedside states they can no longer care for patient at home.

## 2023-06-09 DIAGNOSIS — Z6820 Body mass index (BMI) 20.0-20.9, adult: Secondary | ICD-10-CM | POA: Diagnosis not present

## 2023-06-09 DIAGNOSIS — Z85828 Personal history of other malignant neoplasm of skin: Secondary | ICD-10-CM | POA: Diagnosis not present

## 2023-06-09 DIAGNOSIS — R627 Adult failure to thrive: Secondary | ICD-10-CM

## 2023-06-09 DIAGNOSIS — Z7984 Long term (current) use of oral hypoglycemic drugs: Secondary | ICD-10-CM | POA: Diagnosis not present

## 2023-06-09 DIAGNOSIS — Z961 Presence of intraocular lens: Secondary | ICD-10-CM | POA: Diagnosis present

## 2023-06-09 DIAGNOSIS — K219 Gastro-esophageal reflux disease without esophagitis: Secondary | ICD-10-CM | POA: Diagnosis present

## 2023-06-09 DIAGNOSIS — Z85820 Personal history of malignant melanoma of skin: Secondary | ICD-10-CM | POA: Diagnosis not present

## 2023-06-09 DIAGNOSIS — R634 Abnormal weight loss: Secondary | ICD-10-CM | POA: Diagnosis present

## 2023-06-09 DIAGNOSIS — Z9841 Cataract extraction status, right eye: Secondary | ICD-10-CM | POA: Diagnosis not present

## 2023-06-09 DIAGNOSIS — R296 Repeated falls: Secondary | ICD-10-CM | POA: Diagnosis present

## 2023-06-09 DIAGNOSIS — Z66 Do not resuscitate: Secondary | ICD-10-CM | POA: Diagnosis present

## 2023-06-09 DIAGNOSIS — Z515 Encounter for palliative care: Secondary | ICD-10-CM | POA: Diagnosis not present

## 2023-06-09 DIAGNOSIS — R1310 Dysphagia, unspecified: Secondary | ICD-10-CM | POA: Diagnosis present

## 2023-06-09 DIAGNOSIS — Z9842 Cataract extraction status, left eye: Secondary | ICD-10-CM | POA: Diagnosis not present

## 2023-06-09 DIAGNOSIS — I129 Hypertensive chronic kidney disease with stage 1 through stage 4 chronic kidney disease, or unspecified chronic kidney disease: Secondary | ICD-10-CM | POA: Diagnosis present

## 2023-06-09 DIAGNOSIS — W19XXXA Unspecified fall, initial encounter: Secondary | ICD-10-CM

## 2023-06-09 DIAGNOSIS — Y92009 Unspecified place in unspecified non-institutional (private) residence as the place of occurrence of the external cause: Secondary | ICD-10-CM | POA: Diagnosis not present

## 2023-06-09 DIAGNOSIS — E785 Hyperlipidemia, unspecified: Secondary | ICD-10-CM | POA: Diagnosis present

## 2023-06-09 DIAGNOSIS — E1122 Type 2 diabetes mellitus with diabetic chronic kidney disease: Secondary | ICD-10-CM | POA: Diagnosis present

## 2023-06-09 DIAGNOSIS — Z9181 History of falling: Secondary | ICD-10-CM | POA: Diagnosis not present

## 2023-06-09 DIAGNOSIS — N400 Enlarged prostate without lower urinary tract symptoms: Secondary | ICD-10-CM | POA: Diagnosis present

## 2023-06-09 DIAGNOSIS — R54 Age-related physical debility: Secondary | ICD-10-CM | POA: Diagnosis present

## 2023-06-09 DIAGNOSIS — N1831 Chronic kidney disease, stage 3a: Secondary | ICD-10-CM | POA: Diagnosis present

## 2023-06-09 DIAGNOSIS — I480 Paroxysmal atrial fibrillation: Secondary | ICD-10-CM | POA: Diagnosis present

## 2023-06-09 DIAGNOSIS — Z87891 Personal history of nicotine dependence: Secondary | ICD-10-CM | POA: Diagnosis not present

## 2023-06-09 DIAGNOSIS — D61818 Other pancytopenia: Secondary | ICD-10-CM | POA: Diagnosis present

## 2023-06-09 MED ORDER — HYDROCERIN EX CREA
TOPICAL_CREAM | Freq: Two times a day (BID) | CUTANEOUS | Status: DC
  Filled 2023-06-09: qty 113

## 2023-06-09 MED ORDER — MUPIROCIN 2 % EX OINT
TOPICAL_OINTMENT | Freq: Two times a day (BID) | CUTANEOUS | Status: DC
  Filled 2023-06-09: qty 22

## 2023-06-09 NOTE — Consult Note (Addendum)
WOC Nurse Consult Note:patient has extensive history of skin cancer (basal cell and melanoma) followed by [facility] dermatology, last note 05/04/2023 where patient had curretage of L temple and R ear basal cell carcinomas; also per their note multiple areas of actinic keratosis to head, neck and back  Reason for Consult: wound care request by family  Wound type: 1.  Full thickness post curettage L temple and R ear  2.  Partial thickness L inner elbow likely traumatic post fall  Pressure Injury POA: NA, no pressure injuries found  Measurement: 1.  L temple 2 cm x 2 cm full thickness 100% red dry  2.  R ear 1 cm x 1 cm full thickness 100% dry hemorrhagic tissue  3.  L inner elbow partial thickness 1 cm x 1 cm 100% pink and dry   Drainage (amount, consistency, odor) none  Periwound: patient noted to have very dry flaky skin, multiple areas to back, neck, head, arms what appear consistent with actinic keratosis. Noted areas of ecchymosis to legs but no wounds.  No pressure injuries noted on examination of back anterior and posterior aspect by this RN.  Dressing procedure/placement/frequency:  Clean L temple and R ear wounds with NS, apply Mupirocin ointment to areas twice daily. May leave open to air after applying Mupirocin.   Cover L arm (elbow) skin tear with foam dressing.  Lift foam daily to assess. Change foam q3 days and prn soiling.  Apply Eucerin cream to arms, legs, chest, back and neck 2 times daily.   POC discussed with patient, bedside nurse and primary MD.  WOC team will not follow.  Please re-consult if further wound care needs arise.   Thank you,    Priscella Mann MSN, RN-BC, [employer] [phone]

## 2023-06-09 NOTE — Progress Notes (Signed)
PROGRESS NOTE    RURY NISBETT  ZOX:[id number] DOB: 25-May-1923 DOA: 06/08/2023 PCP: Patient, No Pcp Per    Assessment & Plan:   Principal Problem:   Comfort measures only status Active Problems:   HTN (hypertension)   Type 2 diabetes mellitus with stage 3a chronic kidney disease, without long-term current use of insulin (HCC)   Fall at home, initial encounter  Assessment and Plan: Failure to thrive: continue w/ comfort care only. Hospice has been consulted   DM2: likely well controlled. Comfort care only   CKDIIIa: comfort care only     HTN: comfort care only  HLD: comfort care only        DVT prophylaxis: none, comfort care only  Code Status: DNR Family Communication: discussed pt's care w/ pt's family at bedside and answered their questions  Disposition Plan: possible to LTC w/ hospice  Level of care: Med-Surg Consultants:  hospice  Procedures:  Antimicrobials:   Subjective: Pt is sleeping comfortably   Objective: Vitals:   06/08/23 1630 06/08/23 1700 06/09/23 0308 06/09/23 0724  BP: (!) 139/49 (!) 141/59 130/69 (!) 164/56  Pulse: (!) 32 (!) 35 (!) 37 (!) 52  Resp: 20 15 16 16   Temp:   97.9 F (36.6 C) 97.6 F (36.4 C)  TempSrc:   Oral Oral  SpO2: 96% 95% 98% 95%  Weight:      Height:       No intake or output data in the 24 hours ending 06/09/23 0736 Filed Weights   06/08/23 0905  Weight: 63.5 kg    Examination:  General exam: Appears comfortable  Respiratory system: diminished breath sounds b/l  Cardiovascular system: S1 & S2+. No rubs, gallops or clicks.  Gastrointestinal system: Abdomen is soft, NT, ND & normal bowel sounds  Central nervous system: sleeping comfortably     Data Reviewed: I have personally reviewed following labs and imaging studies  CBC: Recent Labs  Lab 06/08/23 0911  WBC 12.8*  NEUTROABS 11.2*  HGB 12.9*  HCT 39.7  MCV 93.0  PLT 233   Basic Metabolic Panel: Recent Labs  Lab 06/08/23 1124  NA 141   K 3.8  CL 108  CO2 21*  GLUCOSE 145*  BUN 20  CREATININE 1.20  CALCIUM 8.4*   GFR: Estimated Creatinine Clearance: 31.6 mL/min (by C-G formula based on SCr of 1.2 mg/dL). Liver Function Tests: Recent Labs  Lab 06/08/23 1124  AST 27  ALT 17  ALKPHOS 45  BILITOT 1.7*  PROT 5.9*  ALBUMIN 3.3*   No results for input(s): "LIPASE", "AMYLASE" in the last 168 hours. No results for input(s): "AMMONIA" in the last 168 hours. Coagulation Profile: No results for input(s): "INR", "PROTIME" in the last 168 hours. Cardiac Enzymes: No results for input(s): "CKTOTAL", "CKMB", "CKMBINDEX", "TROPONINI" in the last 168 hours. BNP (last 3 results) No results for input(s): "PROBNP" in the last 8760 hours. HbA1C: No results for input(s): "HGBA1C" in the last 72 hours. CBG: No results for input(s): "GLUCAP" in the last 168 hours. Lipid Profile: No results for input(s): "CHOL", "HDL", "LDLCALC", "TRIG", "CHOLHDL", "LDLDIRECT" in the last 72 hours. Thyroid Function Tests: No results for input(s): "TSH", "T4TOTAL", "FREET4", "T3FREE", "THYROIDAB" in the last 72 hours. Anemia Panel: No results for input(s): "VITAMINB12", "FOLATE", "FERRITIN", "TIBC", "IRON", "RETICCTPCT" in the last 72 hours. Sepsis Labs: No results for input(s): "PROCALCITON", "LATICACIDVEN" in the last 168 hours.  No results found for this or any previous visit (from the past 240 hour(s)).  Radiology Studies: CT ABDOMEN PELVIS WO CONTRAST  Result Date: 06/08/2023 CLINICAL DATA:  Acute, non localized abdominal pain. Multiple falls over the past few days. Decreased appetite over the past week. EXAM: CT ABDOMEN AND PELVIS WITHOUT CONTRAST TECHNIQUE: Multidetector CT imaging of the abdomen and pelvis was performed following the standard protocol without IV contrast. RADIATION DOSE REDUCTION: This exam was performed according to the departmental dose-optimization program which includes automated exposure control,  adjustment of the mA and/or kV according to patient size and/or use of iterative reconstruction technique. COMPARISON:  09/08/2013. FINDINGS: Lower chest: Moderate-sized right pleural effusion and smaller left pleural effusion. Adjacent bilateral compressive atelectasis, left greater than right. A huge hiatal hernia is again demonstrated, this time containing stomach, colon and a portion of the pancreas. Hepatobiliary: Dilated gallbladder containing multiple tiny dependent calculi. No gallbladder wall thickening or pericholecystic fluid. Small calcified granuloma in the liver. Pancreas: Mildly atrophied. Spleen: Normal in size without focal abnormality. Adrenals/Urinary Tract: Large right renal cyst and smaller cyst with a mild increase in size. These both continued have appearances compatible with simple cysts and do not need imaging follow-up. Unremarkable adrenal glands, ureters and urinary bladder. 2 mm left renal calculus. Stomach/Bowel: The entire stomach is in the large hiatal hernia as well as a portion of the splenic flexure, distal transverse colon and proximal descending colon. Multiple descending and sigmoid colon diverticula without evidence of diverticulitis. Surgically absent appendix. Unremarkable small bowel. Vascular/Lymphatic: Atheromatous arterial calcifications without aneurysm. No enlarged lymph nodes. Reproductive: Coarse calcifications in a normal-sized prostate gland. Other: Small bilateral inguinal hernias containing fat. Diffuse subcutaneous edema. No free peritoneal fluid. Musculoskeletal: Lumbar and lower thoracic spine degenerative changes. IMPRESSION: 1. Moderate-sized right pleural effusion and smaller left pleural effusion with adjacent bilateral compressive atelectasis, left greater than right. 2. Huge hiatal containing the entire stomach and portions of the pancreas and colon. 3. Cholelithiasis with a dilated gallbladder. No gallbladder wall thickening or pericholecystic fluid. 4.  2 mm nonobstructing left renal calculus. 5. Diffuse subcutaneous edema. Electronically Signed   By: Beckie Salts M.D.   On: 06/08/2023 16:05   DG Chest Port 1 View  Result Date: 06/08/2023 CLINICAL DATA:  Weakness EXAM: PORTABLE CHEST 1 VIEW COMPARISON:  Chest x-ray 04/14/2020 FINDINGS: Large hiatal hernia is present. Small bilateral pleural effusions and minimal bibasilar opacities are present. The cardiomediastinal silhouette is grossly within normal limits. There is no pneumothorax or acute fracture. IMPRESSION: 1. Small bilateral pleural effusions and minimal bibasilar opacities, likely atelectasis. 2. Large hiatal hernia. Electronically Signed   By: Darliss Cheney M.D.   On: 06/08/2023 15:16   CT Head Wo Contrast  Result Date: 06/08/2023 CLINICAL DATA:  89 year old male with multiple recent falls. Decreased p.o. Found down. EXAM: CT HEAD WITHOUT CONTRAST TECHNIQUE: Contiguous axial images were obtained from the base of the skull through the vertex without intravenous contrast. RADIATION DOSE REDUCTION: This exam was performed according to the departmental dose-optimization program which includes automated exposure control, adjustment of the mA and/or kV according to patient size and/or use of iterative reconstruction technique. COMPARISON:  Head CT 06/23/2018. FINDINGS: Brain: Cerebral volume is within normal limits for age. No midline shift, ventriculomegaly, mass effect, evidence of mass lesion, intracranial hemorrhage or evidence of cortically based acute infarction. Chronic asymmetric left mesial temporal lobe volume loss is stable and nonspecific (coronal image 35). No discrete cortical encephalomalacia. And gray-white differentiation remains within normal limits for age. Vascular: No suspicious intracranial vascular hyperdensity. Calcified atherosclerosis at the skull  base. Skull: No acute osseous abnormality identified. Sinuses/Orbits: Stable paranasal sinus aeration with scattered mild mucosal  thickening and/or mucous retention cysts. New polypoid opacity in the posterior right nasal cavity is up to 2.3 cm on series 3, image 7. Tympanic cavities and mastoids remain clear. Other: No orbit or scalp soft tissue injury identified. IMPRESSION: 1. No acute intracranial abnormality or traumatic injury identified. 2. New 2.3 cm polypoid opacity in the posterior right nasal cavity since 2019. This might be retained secretions but a right nasal polyp is not excluded. Electronically Signed   By: Odessa Fleming M.D.   On: 06/08/2023 10:42   DG Foot 2 Views Right  Result Date: 06/08/2023 CLINICAL DATA:  Right foot pain after fall. EXAM: RIGHT FOOT - 2 VIEW COMPARISON:  None Available. FINDINGS: There is no evidence of fracture or dislocation. Mild degenerative change for age. Sclerotic density in the great toe distal phalanx has a nonaggressive appearance. Slight hammertoe deformity of the toes. Incidental os peroneal. Plantar calcaneal spur and Achilles tendon enthesophyte. Mild soft tissue edema. IMPRESSION: 1. No acute fracture or dislocation of the right foot. 2. Mild degenerative change for age. Electronically Signed   By: Narda Rutherford M.D.   On: 06/08/2023 10:37   DG Forearm Left  Result Date: 06/08/2023 CLINICAL DATA:  Fall with laceration. EXAM: LEFT FOREARM - 2 VIEW COMPARISON:  None Available. FINDINGS: No fracture of the radius or ulna. Moderate to advanced chronic degenerative change of the wrist. No wrist or elbow dislocation. Punctate density in the soft tissues adjacent to the volar mid ulna may be a chronic calcification or tiny foreign body. No soft tissue gas. Mild generalized soft tissue edema. IMPRESSION: 1. No fracture of the radius or ulna. 2. Punctate density in the soft tissues adjacent to the volar mid ulna may be a chronic calcification or tiny foreign body. 3. Chronic wrist arthropathy. Electronically Signed   By: Narda Rutherford M.D.   On: 06/08/2023 10:36        Scheduled  Meds:  melatonin  2.5 mg Oral QHS   risperiDONE  1 mg Oral Once   sodium chloride flush  3 mL Intravenous Q12H   Continuous Infusions:  sodium chloride       LOS: 0 days    Time spent: 25 mins     Charise Killian, MD [facility] Pager [phone]  If 7PM-7AM, please contact night-coverage [email or web address] 06/09/2023, 7:36 AM

## 2023-06-09 NOTE — Progress Notes (Signed)
Nutrition Brief Note  Chart reviewed. Pt now transitioning to comfort care.  No further nutrition interventions planned at this time.  Please re-consult as needed.   Jenifer W, RD, LDN, CDCES Registered Dietitian II Certified Diabetes Care and Education Specialist Please refer to AMION for RD and/or RD on-call/weekend/after hours pager   

## 2023-06-09 NOTE — Progress Notes (Signed)
[facility] Liaison Note:    HL met with patient's two sons and daughter in law today.  HL was able to answer questions that the family had about Hospice services in a LTC facility.  Family stated that they want [facility] to follow patient once a LTC bed has been found.     Please don't hesitate to call with any Hospice related questions or concerns.    Thank you for the opportunity to participate in this patient's care. [facility] Liaison [phone]

## 2023-06-09 NOTE — Progress Notes (Signed)
   06/09/23 1200  Spiritual Encounters  Type of Visit Initial  Care provided to: Pt and family  Referral source Nurse (RN/NT/LPN)  Reason for visit End-of-life  OnCall Visit No   Chaplain met with patient a family.  Patient is eating with pleasant mood and affect.  Family notes local family Renato Gails visited yesterday and expecting a second visit on today.  Chaplain spiritual support services not currently needed, but available as the need arises.

## 2023-06-09 NOTE — Plan of Care (Signed)
Patient admitted for comfort care s/p fall at home. Patient is very hard of hearing. He is confused and oriented x 1. Patient has been cooperative with care and free of falls throughout the night. Nursing will continue to monitor the patients progress with care plan.

## 2023-06-09 NOTE — TOC Progression Note (Addendum)
Transition of Care [facility]) - Progression Note    Patient Details  Name: Jeffery Avila MRN: [id number] Date of Birth: 03-Feb-1923  [facility] CM/SW Contact  Marlowe Sax, RN Phone Number: 06/09/2023, 10:21 AM  Clinical Narrative:   l Reached out to [facility] with [employer] to inquire about the status of th4e LTC bed search, awaiting a response     Spoke with Duwayne Heck with [employer] She provided the Family with the needed information to apply for Community assistance thru the [place], She will tour some facilities with the family tomorrow I met with the patient and family in the room He is alert and oriented to Person and place and some of situation The daughter in law had some questions, they are understanding that [facility] will follow the patient where he goes to LTC The patient is able to feed himself, he is able to get up to the bathroom but for safety needs a 1 person assist, I asked him if he thought he would do better with a cane or a Achord, he said no he gets along on his own just fine, however he has had multiple falls lately The family asked me to update [employer] with the financial information that he gets approx 2000$ per month, I did provide Danielle with that information He has bilateral hearing aides and does well with them The patient agreed to reach out to get assistance before attempting to get up.  Expected Discharge Plan and Services     Post Acute Care Choice: Hospice Living arrangements for the past 2 months: Single Family Home                                       Social Determinants of Health (SDOH) Interventions SDOH Screenings   Food Insecurity: No Food Insecurity (06/09/2023)  Housing: Low Risk  (06/09/2023)  Transportation Needs: No Transportation Needs (06/09/2023)  Utilities: Not At Risk (06/09/2023)  Tobacco Use: Medium Risk (06/08/2023)    Readmission Risk Interventions     No data to display

## 2023-06-09 NOTE — Progress Notes (Signed)
                                                     Palliative Care Progress Note   Patient Name: DIMARCO HOLSTAD       Date: 06/09/2023 DOB: 02-13-1923  Age: 89 y.o. MRN#: [id number] Attending Physician: Charise Killian, MD Primary Care Physician: Patient, No Pcp Per Admit Date: 06/08/2023  Consult received.  Chart reviewed.  Patient is on comfort measures with DNR in place.    TOC and hospice [facility] following closely for disposition.  Goals are clear. No acute palliative needs at this time.  I counseled with attending Dr. Mayford Knife who was in agreement for PMT to sign off.  Please re-engage with PMT if goals change, at patient/family's request, or if patient's health deteriorates during hospitalization.  Thank you for allowing the Palliative Medicine Team to assist in the care of ERIEL CHAVEZ.  Samara Deist L. Bonita Quin, DNP, FNP-BC Palliative Medicine Team  NO charge

## 2023-06-10 ENCOUNTER — Encounter: Payer: Self-pay | Admitting: Internal Medicine

## 2023-06-10 DIAGNOSIS — Z515 Encounter for palliative care: Secondary | ICD-10-CM | POA: Diagnosis not present

## 2023-06-10 DIAGNOSIS — R627 Adult failure to thrive: Secondary | ICD-10-CM | POA: Diagnosis not present

## 2023-06-10 MED ORDER — ENSURE ENLIVE PO LIQD
237.0000 mL | Freq: Two times a day (BID) | ORAL | Status: DC
  Administered 2023-06-11 – 2023-06-14 (×7): 237 mL via ORAL

## 2023-06-10 NOTE — TOC Progression Note (Signed)
Transition of Care [facility]) - Progression Note    Patient Details  Name: Jeffery Avila MRN: [id number] Date of Birth: 05-28-1923  Transition of Care [facility]) CM/SW Contact  Allena Katz, LCSW Phone Number: 06/10/2023, 4:33 PM  Clinical Narrative:   CSW met with family who report they want to go LTC with [facility] hospice vs ALF. Danielle with [employer] no longer assisting due to the possibility of patient being on medicaid.          Expected Discharge Plan and Services     Post Acute Care Choice: Hospice Living arrangements for the past 2 months: Single Family Home                                       Social Determinants of Health (SDOH) Interventions SDOH Screenings   Food Insecurity: No Food Insecurity (06/09/2023)  Housing: Low Risk  (06/09/2023)  Transportation Needs: No Transportation Needs (06/09/2023)  Utilities: Not At Risk (06/09/2023)  Tobacco Use: Medium Risk (06/08/2023)    Readmission Risk Interventions     No data to display

## 2023-06-10 NOTE — Progress Notes (Signed)
[facility] Liaison Note:    HL will follow peripherally until a Long term care bed is available.     Please don't hesitate to call with any Hospice related questions or concerns.    Thank you for the opportunity to participate in this patient's care. [facility] Liaison [phone]

## 2023-06-10 NOTE — Plan of Care (Signed)
Problem: Education: Goal: Knowledge of the prescribed therapeutic regimen will improve 06/10/2023 0535 by Tally Joe, RN Outcome: Progressing 06/10/2023 0535 by Tally Joe, RN Outcome: Progressing   Problem: Coping: Goal: Ability to identify and develop effective coping behavior will improve 06/10/2023 0535 by Tally Joe, RN Outcome: Progressing 06/10/2023 0535 by Tally Joe, RN Outcome: Progressing   Problem: Clinical Measurements: Goal: Quality of life will improve 06/10/2023 0535 by Tally Joe, RN Outcome: Progressing 06/10/2023 0535 by Tally Joe, RN Outcome: Progressing   Problem: Respiratory: Goal: Verbalizations of increased ease of respirations will increase 06/10/2023 0535 by Tally Joe, RN Outcome: Progressing 06/10/2023 0535 by Tally Joe, RN Outcome: Progressing   Problem: Role Relationship: Goal: Family's ability to cope with current situation will improve 06/10/2023 0535 by Tally Joe, RN Outcome: Progressing 06/10/2023 0535 by Tally Joe, RN Outcome: Progressing Goal: Ability to verbalize concerns, feelings, and thoughts to partner or family member will improve 06/10/2023 0535 by Tally Joe, RN Outcome: Progressing 06/10/2023 0535 by Tally Joe, RN Outcome: Progressing   Problem: Pain Management: Goal: Satisfaction with pain management regimen will improve 06/10/2023 0535 by Tally Joe, RN Outcome: Progressing 06/10/2023 0535 by Tally Joe, RN Outcome: Progressing   Problem: Education: Goal: Knowledge of General Education information will improve Description: Including pain rating scale, medication(s)/side effects and non-pharmacologic comfort measures 06/10/2023 0535 by Tally Joe, RN Outcome: Progressing 06/10/2023 0535 by Tally Joe, RN Outcome: Progressing   Problem: Health Behavior/Discharge Planning: Goal: Ability to manage health-related needs will improve 06/10/2023 0535 by Tally Joe, RN Outcome:  Progressing 06/10/2023 0535 by Tally Joe, RN Outcome: Progressing   Problem: Clinical Measurements: Goal: Ability to maintain clinical measurements within normal limits will improve 06/10/2023 0535 by Tally Joe, RN Outcome: Progressing 06/10/2023 0535 by Tally Joe, RN Outcome: Progressing Goal: Will remain free from infection 06/10/2023 0535 by Tally Joe, RN Outcome: Progressing 06/10/2023 0535 by Tally Joe, RN Outcome: Progressing Goal: Diagnostic test results will improve 06/10/2023 0535 by Tally Joe, RN Outcome: Progressing 06/10/2023 0535 by Tally Joe, RN Outcome: Progressing Goal: Respiratory complications will improve 06/10/2023 0535 by Tally Joe, RN Outcome: Progressing 06/10/2023 0535 by Tally Joe, RN Outcome: Progressing Goal: Cardiovascular complication will be avoided 06/10/2023 0535 by Tally Joe, RN Outcome: Progressing 06/10/2023 0535 by Tally Joe, RN Outcome: Progressing   Problem: Activity: Goal: Risk for activity intolerance will decrease 06/10/2023 0535 by Tally Joe, RN Outcome: Progressing 06/10/2023 0535 by Tally Joe, RN Outcome: Progressing   Problem: Nutrition: Goal: Adequate nutrition will be maintained 06/10/2023 0535 by Tally Joe, RN Outcome: Progressing 06/10/2023 0535 by Tally Joe, RN Outcome: Progressing   Problem: Coping: Goal: Level of anxiety will decrease 06/10/2023 0535 by Tally Joe, RN Outcome: Progressing 06/10/2023 0535 by Tally Joe, RN Outcome: Progressing   Problem: Elimination: Goal: Will not experience complications related to bowel motility 06/10/2023 0535 by Tally Joe, RN Outcome: Progressing 06/10/2023 0535 by Tally Joe, RN Outcome: Progressing Goal: Will not experience complications related to urinary retention 06/10/2023 0535 by Tally Joe, RN Outcome: Progressing 06/10/2023 0535 by Tally Joe, RN Outcome: Progressing   Problem: Pain Managment: Goal: General experience  of comfort will improve 06/10/2023 0535 by Tally Joe, RN Outcome: Progressing 06/10/2023 0535 by Tally Joe, RN Outcome: Progressing   Problem: Safety:  Goal: Ability to remain free from injury will improve 06/10/2023 0535 by Tally Joe, RN Outcome: Progressing 06/10/2023 0535 by Tally Joe, RN Outcome: Progressing   Problem: Skin Integrity: Goal: Risk for impaired skin integrity will decrease 06/10/2023 0535 by Tally Joe, RN Outcome: Progressing 06/10/2023 0535 by Tally Joe, RN Outcome: Progressing

## 2023-06-10 NOTE — [place] FL2 (Signed)
[place] MEDICAID FL2 LEVEL OF CARE FORM     IDENTIFICATION  Patient Name: Jeffery Avila Birthdate: May 31, 1923 Sex: male Admission Date (Current Location): 06/08/2023  [place] and [place] Number:  Chiropodist and Address:  [facility], [address] [place], [place] [address]      Provider Number: [id number]  Attending Physician Name and Address:  Charise Killian, MD  Relative Name and Phone Number:       Current Level of Care:   Recommended Level of Care:  (LTC) Prior Approval Number:    Date Approved/Denied:   PASRR Number: [id number]  Discharge Plan: SNF    Current Diagnoses: Patient Active Problem List   Diagnosis Date Noted   Fall at home, initial encounter 06/09/2023   Comfort measures only status 06/08/2023   Hypomagnesemia    HTN (hypertension) 04/14/2020   Type 2 diabetes mellitus with stage 3a chronic kidney disease, without long-term current use of insulin (HCC) 04/14/2020   AF (paroxysmal atrial fibrillation) (HCC) 04/14/2020   Gastroenteritis due to COVID-19 virus 04/14/2020   BPH (benign prostatic hyperplasia) 04/14/2020   Generalized weakness 04/14/2020   Pancytopenia (HCC) 04/14/2020   Recurrent skin cancer 04/14/2020   Suspect early Pneumonia due to COVID-19 virus 04/14/2020    Orientation RESPIRATION BLADDER Height & Weight     Self  Normal Continent Weight: 140 lb (63.5 kg) Height:  5\' 9"  (175.3 cm)  BEHAVIORAL SYMPTOMS/MOOD NEUROLOGICAL BOWEL NUTRITION STATUS      Continent    AMBULATORY STATUS COMMUNICATION OF NEEDS Skin   Limited Assist Verbally Skin abrasions                       Personal Care Assistance Level of Assistance  Bathing, Dressing, Feeding Bathing Assistance: Limited assistance Feeding assistance: Limited assistance Dressing Assistance: Limited assistance     Functional Limitations Info  Sight, Hearing, Speech Sight Info: Adequate Hearing Info: Adequate Speech Info:  Adequate    SPECIAL CARE FACTORS FREQUENCY                       Contractures Contractures Info: Not present    Additional Factors Info  Code Status Code Status Info: DNR             Current Medications (06/10/2023):  This is the current hospital active medication list Current Facility-Administered Medications  Medication Dose Route Frequency Provider Last Rate Last Admin   0.9 %  sodium chloride infusion  250 mL Intravenous PRN Gertha Calkin, MD       acetaminophen (TYLENOL) tablet 650 mg  650 mg Oral Q6H PRN Gertha Calkin, MD       Or   acetaminophen (TYLENOL) suppository 650 mg  650 mg Rectal Q6H PRN Gertha Calkin, MD       antiseptic oral rinse (BIOTENE) solution 15 mL  15 mL Topical PRN Gertha Calkin, MD       glycopyrrolate (ROBINUL) tablet 1 mg  1 mg Oral Q4H PRN Gertha Calkin, MD       Or   glycopyrrolate (ROBINUL) injection 0.2 mg  0.2 mg Subcutaneous Q4H PRN Gertha Calkin, MD       Or   glycopyrrolate (ROBINUL) injection 0.2 mg  0.2 mg Intravenous Q4H PRN Gertha Calkin, MD       haloperidol (HALDOL) tablet 0.5 mg  0.5 mg Oral Q4H PRN Gertha Calkin, MD  Or   haloperidol (HALDOL) 2 MG/ML solution 0.5 mg  0.5 mg Sublingual Q4H PRN Gertha Calkin, MD       Or   haloperidol lactate (HALDOL) injection 0.5 mg  0.5 mg Intravenous Q4H PRN Gertha Calkin, MD       hydrocerin (EUCERIN) cream   Topical BID Charise Killian, MD   Given at 06/10/23 1015   melatonin tablet 2.5 mg  2.5 mg Oral QHS Mumma, Carollee Herter, MD       mupirocin ointment (BACTROBAN) 2 %   Topical BID Charise Killian, MD   Given at 06/10/23 1016   ondansetron (ZOFRAN-ODT) disintegrating tablet 4 mg  4 mg Oral Q6H PRN Gertha Calkin, MD       Or   ondansetron [facility]) injection 4 mg  4 mg Intravenous Q6H PRN Gertha Calkin, MD       polyvinyl alcohol (LIQUIFILM TEARS) 1.4 % ophthalmic solution 1 drop  1 drop Both Eyes QID PRN Gertha Calkin, MD       risperiDONE (RISPERDAL) tablet 1 mg  1 mg Oral  Once Mumma, Carollee Herter, MD       sodium chloride flush (NS) 0.9 % injection 3 mL  3 mL Intravenous Q12H Irena Cords V, MD       sodium chloride flush (NS) 0.9 % injection 3 mL  3 mL Intravenous PRN Gertha Calkin, MD         Discharge Medications: Please see discharge summary for a list of discharge medications.  Relevant Imaging Results:  Relevant Lab Results:   Additional Information [id number]  Allena Katz, LCSW

## 2023-06-10 NOTE — Progress Notes (Signed)
PROGRESS NOTE    Jeffery Avila  ZOX:[id number] DOB: 1923/08/17 DOA: 06/08/2023 PCP: Patient, No Pcp Per    Assessment & Plan:   Principal Problem:   Comfort measures only status Active Problems:   HTN (hypertension)   Type 2 diabetes mellitus with stage 3a chronic kidney disease, without long-term current use of insulin (HCC)   Fall at home, initial encounter  Assessment and Plan: Failure to thrive: continue w/ comfort care only. Hospice following and recs apprec   DM2: likely well controlled. Comfort care only. Dysphagia III diet as pt trouble swallowing sometimes as per pt's family   CKDIIIa: comfort care only    HTN: comfort care only   HLD: comfort care only         DVT prophylaxis: none, comfort care only  Code Status: DNR Family Communication: discussed pt's care w/ pt's family at bedside and answered their questions  Disposition Plan: possible to LTC w/ hospice  Level of care: Med-Surg Consultants:  hospice  Procedures:  Antimicrobials:   Subjective: Pt denies any complaints  Objective: Vitals:   06/09/23 0308 06/09/23 0724 06/09/23 1602 06/09/23 1619  BP: 130/69 (!) 164/56 (!) 153/58 (!) 165/61  Pulse: (!) 37 (!) 52 (!) 39 (!) 38  Resp: 16 16 18    Temp: 97.9 F (36.6 C) 97.6 F (36.4 C) 97.7 F (36.5 C) 98.1 F (36.7 C)  TempSrc: Oral Oral  Oral  SpO2: 98% 95% 98% 99%  Weight:      Height:       No intake or output data in the 24 hours ending 06/10/23 0811 Filed Weights   06/08/23 0905  Weight: 63.5 kg    Examination:  General exam: Appears calm & comfortable   Respiratory system: decreased breath sounds b/l  Cardiovascular system: S1/S2+. No rubs or clicks  Gastrointestinal system: abd is soft, NT, ND & hypoactive bowel sounds Central nervous system: judgement and insight appears at baseline.     Data Reviewed: I have personally reviewed following labs and imaging studies  CBC: Recent Labs  Lab 06/08/23 0911  WBC 12.8*   NEUTROABS 11.2*  HGB 12.9*  HCT 39.7  MCV 93.0  PLT 233   Basic Metabolic Panel: Recent Labs  Lab 06/08/23 1124  NA 141  K 3.8  CL 108  CO2 21*  GLUCOSE 145*  BUN 20  CREATININE 1.20  CALCIUM 8.4*   GFR: Estimated Creatinine Clearance: 31.6 mL/min (by C-G formula based on SCr of 1.2 mg/dL). Liver Function Tests: Recent Labs  Lab 06/08/23 1124  AST 27  ALT 17  ALKPHOS 45  BILITOT 1.7*  PROT 5.9*  ALBUMIN 3.3*   No results for input(s): "LIPASE", "AMYLASE" in the last 168 hours. No results for input(s): "AMMONIA" in the last 168 hours. Coagulation Profile: No results for input(s): "INR", "PROTIME" in the last 168 hours. Cardiac Enzymes: No results for input(s): "CKTOTAL", "CKMB", "CKMBINDEX", "TROPONINI" in the last 168 hours. BNP (last 3 results) No results for input(s): "PROBNP" in the last 8760 hours. HbA1C: No results for input(s): "HGBA1C" in the last 72 hours. CBG: No results for input(s): "GLUCAP" in the last 168 hours. Lipid Profile: No results for input(s): "CHOL", "HDL", "LDLCALC", "TRIG", "CHOLHDL", "LDLDIRECT" in the last 72 hours. Thyroid Function Tests: No results for input(s): "TSH", "T4TOTAL", "FREET4", "T3FREE", "THYROIDAB" in the last 72 hours. Anemia Panel: No results for input(s): "VITAMINB12", "FOLATE", "FERRITIN", "TIBC", "IRON", "RETICCTPCT" in the last 72 hours. Sepsis Labs: No results for input(s): "  PROCALCITON", "LATICACIDVEN" in the last 168 hours.  No results found for this or any previous visit (from the past 240 hour(s)).       Radiology Studies: CT ABDOMEN PELVIS WO CONTRAST  Result Date: 06/08/2023 CLINICAL DATA:  Acute, non localized abdominal pain. Multiple falls over the past few days. Decreased appetite over the past week. EXAM: CT ABDOMEN AND PELVIS WITHOUT CONTRAST TECHNIQUE: Multidetector CT imaging of the abdomen and pelvis was performed following the standard protocol without IV contrast. RADIATION DOSE REDUCTION:  This exam was performed according to the departmental dose-optimization program which includes automated exposure control, adjustment of the mA and/or kV according to patient size and/or use of iterative reconstruction technique. COMPARISON:  09/08/2013. FINDINGS: Lower chest: Moderate-sized right pleural effusion and smaller left pleural effusion. Adjacent bilateral compressive atelectasis, left greater than right. A huge hiatal hernia is again demonstrated, this time containing stomach, colon and a portion of the pancreas. Hepatobiliary: Dilated gallbladder containing multiple tiny dependent calculi. No gallbladder wall thickening or pericholecystic fluid. Small calcified granuloma in the liver. Pancreas: Mildly atrophied. Spleen: Normal in size without focal abnormality. Adrenals/Urinary Tract: Large right renal cyst and smaller cyst with a mild increase in size. These both continued have appearances compatible with simple cysts and do not need imaging follow-up. Unremarkable adrenal glands, ureters and urinary bladder. 2 mm left renal calculus. Stomach/Bowel: The entire stomach is in the large hiatal hernia as well as a portion of the splenic flexure, distal transverse colon and proximal descending colon. Multiple descending and sigmoid colon diverticula without evidence of diverticulitis. Surgically absent appendix. Unremarkable small bowel. Vascular/Lymphatic: Atheromatous arterial calcifications without aneurysm. No enlarged lymph nodes. Reproductive: Coarse calcifications in a normal-sized prostate gland. Other: Small bilateral inguinal hernias containing fat. Diffuse subcutaneous edema. No free peritoneal fluid. Musculoskeletal: Lumbar and lower thoracic spine degenerative changes. IMPRESSION: 1. Moderate-sized right pleural effusion and smaller left pleural effusion with adjacent bilateral compressive atelectasis, left greater than right. 2. Huge hiatal containing the entire stomach and portions of the  pancreas and colon. 3. Cholelithiasis with a dilated gallbladder. No gallbladder wall thickening or pericholecystic fluid. 4. 2 mm nonobstructing left renal calculus. 5. Diffuse subcutaneous edema. Electronically Signed   By: Beckie Salts M.D.   On: 06/08/2023 16:05   DG Chest Port 1 View  Result Date: 06/08/2023 CLINICAL DATA:  Weakness EXAM: PORTABLE CHEST 1 VIEW COMPARISON:  Chest x-ray 04/14/2020 FINDINGS: Large hiatal hernia is present. Small bilateral pleural effusions and minimal bibasilar opacities are present. The cardiomediastinal silhouette is grossly within normal limits. There is no pneumothorax or acute fracture. IMPRESSION: 1. Small bilateral pleural effusions and minimal bibasilar opacities, likely atelectasis. 2. Large hiatal hernia. Electronically Signed   By: Darliss Cheney M.D.   On: 06/08/2023 15:16   CT Head Wo Contrast  Result Date: 06/08/2023 CLINICAL DATA:  89 year old male with multiple recent falls. Decreased p.o. Found down. EXAM: CT HEAD WITHOUT CONTRAST TECHNIQUE: Contiguous axial images were obtained from the base of the skull through the vertex without intravenous contrast. RADIATION DOSE REDUCTION: This exam was performed according to the departmental dose-optimization program which includes automated exposure control, adjustment of the mA and/or kV according to patient size and/or use of iterative reconstruction technique. COMPARISON:  Head CT 06/23/2018. FINDINGS: Brain: Cerebral volume is within normal limits for age. No midline shift, ventriculomegaly, mass effect, evidence of mass lesion, intracranial hemorrhage or evidence of cortically based acute infarction. Chronic asymmetric left mesial temporal lobe volume loss is stable and  nonspecific (coronal image 35). No discrete cortical encephalomalacia. And gray-white differentiation remains within normal limits for age. Vascular: No suspicious intracranial vascular hyperdensity. Calcified atherosclerosis at the skull base.  Skull: No acute osseous abnormality identified. Sinuses/Orbits: Stable paranasal sinus aeration with scattered mild mucosal thickening and/or mucous retention cysts. New polypoid opacity in the posterior right nasal cavity is up to 2.3 cm on series 3, image 7. Tympanic cavities and mastoids remain clear. Other: No orbit or scalp soft tissue injury identified. IMPRESSION: 1. No acute intracranial abnormality or traumatic injury identified. 2. New 2.3 cm polypoid opacity in the posterior right nasal cavity since 2019. This might be retained secretions but a right nasal polyp is not excluded. Electronically Signed   By: Odessa Fleming M.D.   On: 06/08/2023 10:42   DG Foot 2 Views Right  Result Date: 06/08/2023 CLINICAL DATA:  Right foot pain after fall. EXAM: RIGHT FOOT - 2 VIEW COMPARISON:  None Available. FINDINGS: There is no evidence of fracture or dislocation. Mild degenerative change for age. Sclerotic density in the great toe distal phalanx has a nonaggressive appearance. Slight hammertoe deformity of the toes. Incidental os peroneal. Plantar calcaneal spur and Achilles tendon enthesophyte. Mild soft tissue edema. IMPRESSION: 1. No acute fracture or dislocation of the right foot. 2. Mild degenerative change for age. Electronically Signed   By: Narda Rutherford M.D.   On: 06/08/2023 10:37   DG Forearm Left  Result Date: 06/08/2023 CLINICAL DATA:  Fall with laceration. EXAM: LEFT FOREARM - 2 VIEW COMPARISON:  None Available. FINDINGS: No fracture of the radius or ulna. Moderate to advanced chronic degenerative change of the wrist. No wrist or elbow dislocation. Punctate density in the soft tissues adjacent to the volar mid ulna may be a chronic calcification or tiny foreign body. No soft tissue gas. Mild generalized soft tissue edema. IMPRESSION: 1. No fracture of the radius or ulna. 2. Punctate density in the soft tissues adjacent to the volar mid ulna may be a chronic calcification or tiny foreign body. 3.  Chronic wrist arthropathy. Electronically Signed   By: Narda Rutherford M.D.   On: 06/08/2023 10:36        Scheduled Meds:  hydrocerin   Topical BID   melatonin  2.5 mg Oral QHS   mupirocin ointment   Topical BID   risperiDONE  1 mg Oral Once   sodium chloride flush  3 mL Intravenous Q12H   Continuous Infusions:  sodium chloride       LOS: 1 day    Time spent: 25 mins     Charise Killian, MD [facility] Pager [phone]  If 7PM-7AM, please contact night-coverage [email or web address] 06/10/2023, 8:11 AM

## 2023-06-11 DIAGNOSIS — Z515 Encounter for palliative care: Secondary | ICD-10-CM | POA: Diagnosis not present

## 2023-06-11 DIAGNOSIS — R627 Adult failure to thrive: Secondary | ICD-10-CM | POA: Diagnosis not present

## 2023-06-11 NOTE — TOC Progression Note (Addendum)
Transition of Care [facility]) - Progression Note    Patient Details  Name: Jeffery Avila MRN: [id number] Date of Birth: 1923/04/08  Transition of Care [facility]) CM/SW Contact  Allena Katz, LCSW Phone Number: 06/11/2023, 9:54 AM  Clinical Narrative:   No accepting beds at this time. Pt needs a medicaid application started. Darcy with [employer].    1:40pm  Ricky at [employer] he needs bank statements for patients son before he can take/offer for LTC. Son notified and will send to ricky.      Expected Discharge Plan and Services     Post Acute Care Choice: Hospice Living arrangements for the past 2 months: Single Family Home                                       Social Determinants of Health (SDOH) Interventions SDOH Screenings   Food Insecurity: No Food Insecurity (06/09/2023)  Housing: Low Risk  (06/09/2023)  Transportation Needs: No Transportation Needs (06/09/2023)  Utilities: Not At Risk (06/09/2023)  Tobacco Use: Medium Risk (06/10/2023)    Readmission Risk Interventions     No data to display

## 2023-06-11 NOTE — Progress Notes (Signed)
 [facility] Liaison Note:    HL will follow peripherally until a Long term care bed is available.     Please don't hesitate to call with any Hospice related questions or concerns.    Thank you for the opportunity to participate in this patient's care. [facility] Liaison [phone]

## 2023-06-11 NOTE — Care Management Important Message (Signed)
Important Message  Patient Details  Name: Jeffery Avila MRN: [id number] Date of Birth: 11/10/1922   Medicare Important Message Given:  Other (see comment)  Patient is on comfort care and will discharge with Hospice. Out of respect for the patient and family no Important Message from [facility] given.    [name] 06/11/2023, 8:01 AM

## 2023-06-11 NOTE — Progress Notes (Signed)
PROGRESS NOTE    Jeffery Avila  ION:[id number] DOB: Aug 19, 1923 DOA: 06/08/2023 PCP: Patient, No Pcp Per    Assessment & Plan:   Principal Problem:   Comfort measures only status Active Problems:   HTN (hypertension)   Type 2 diabetes mellitus with stage 3a chronic kidney disease, without long-term current use of insulin (HCC)   Fall at home, initial encounter  Assessment and Plan: Failure to thrive: continue w/ comfort care only. Waiting on LTC placement and CM is working on this.[facility]ice following and recs apprec   DM2: likely well controlled. Comfort care only. Dysphagia III diet as pt has trouble swallowing sometimes as per pt's family   CKDIIIa: comfort care only    HTN: comfort care only   HLD: comfort care only         DVT prophylaxis: none, comfort care only  Code Status: DNR Family Communication: discussed pt's care w/ pt's family at bedside and answered their questions  Disposition Plan: waiting on placement at LTC w/ hospice. CM is working on this   Level of care: Med-Surg Consultants:  hospice  Procedures:  Antimicrobials:   Subjective: Pt c/o fatigue   Objective: Vitals:   06/09/23 1602 06/09/23 1619 06/10/23 0841 06/10/23 1941  BP: (!) 153/58 (!) 165/61 (!) 151/55 (!) 148/57  Pulse: (!) 39 (!) 38 (!) 34 (!) 40  Resp: 18  18 20   Temp: 97.7 F (36.5 C) 98.1 F (36.7 C) 98.6 F (37 C) 98.2 F (36.8 C)  TempSrc:  Oral  Oral  SpO2: 98% 99% 96% 96%  Weight:      Height:       No intake or output data in the 24 hours ending 06/11/23 0808 Filed Weights   06/08/23 0905  Weight: 63.5 kg    Examination:  General exam: appears comfortable    Respiratory system: diminished breath sounds b/l Cardiovascular system: S1 & S2+. No rubs or clicks Gastrointestinal system: abd is soft, NT, ND & normal bowel sounds  Central nervous system: judgement and insight appears at baseline     Data Reviewed: I have personally reviewed following labs  and imaging studies  CBC: Recent Labs  Lab 06/08/23 0911  WBC 12.8*  NEUTROABS 11.2*  HGB 12.9*  HCT 39.7  MCV 93.0  PLT 233   Basic Metabolic Panel: Recent Labs  Lab 06/08/23 1124  NA 141  K 3.8  CL 108  CO2 21*  GLUCOSE 145*  BUN 20  CREATININE 1.20  CALCIUM 8.4*   GFR: Estimated Creatinine Clearance: 31.6 mL/min (by C-G formula based on SCr of 1.2 mg/dL). Liver Function Tests: Recent Labs  Lab 06/08/23 1124  AST 27  ALT 17  ALKPHOS 45  BILITOT 1.7*  PROT 5.9*  ALBUMIN 3.3*   No results for input(s): "LIPASE", "AMYLASE" in the last 168 hours. No results for input(s): "AMMONIA" in the last 168 hours. Coagulation Profile: No results for input(s): "INR", "PROTIME" in the last 168 hours. Cardiac Enzymes: No results for input(s): "CKTOTAL", "CKMB", "CKMBINDEX", "TROPONINI" in the last 168 hours. BNP (last 3 results) No results for input(s): "PROBNP" in the last 8760 hours. HbA1C: No results for input(s): "HGBA1C" in the last 72 hours. CBG: No results for input(s): "GLUCAP" in the last 168 hours. Lipid Profile: No results for input(s): "CHOL", "HDL", "LDLCALC", "TRIG", "CHOLHDL", "LDLDIRECT" in the last 72 hours. Thyroid Function Tests: No results for input(s): "TSH", "T4TOTAL", "FREET4", "T3FREE", "THYROIDAB" in the last 72 hours. Anemia Panel: No  results for input(s): "VITAMINB12", "FOLATE", "FERRITIN", "TIBC", "IRON", "RETICCTPCT" in the last 72 hours. Sepsis Labs: No results for input(s): "PROCALCITON", "LATICACIDVEN" in the last 168 hours.  No results found for this or any previous visit (from the past 240 hour(s)).       Radiology Studies: No results found.      Scheduled Meds:  feeding supplement  237 mL Oral BID BM   hydrocerin   Topical BID   melatonin  2.5 mg Oral QHS   mupirocin ointment   Topical BID   risperiDONE  1 mg Oral Once   Continuous Infusions:     LOS: 2 days    Time spent: 25 mins     Charise Killian,  MD [facility] Pager [phone]  If 7PM-7AM, please contact night-coverage [email or web address] 06/11/2023, 8:08 AM

## 2023-06-12 DIAGNOSIS — R627 Adult failure to thrive: Secondary | ICD-10-CM | POA: Diagnosis not present

## 2023-06-12 DIAGNOSIS — Z515 Encounter for palliative care: Secondary | ICD-10-CM | POA: Diagnosis not present

## 2023-06-12 NOTE — Progress Notes (Signed)
PROGRESS NOTE    Jeffery Avila  YQI:[id number] DOB: 1923/02/08 DOA: 06/08/2023 PCP: Patient, No Pcp Per    Assessment & Plan:   Principal Problem:   Comfort measures only status Active Problems:   HTN (hypertension)   Type 2 diabetes mellitus with stage 3a chronic kidney disease, without long-term current use of insulin (HCC)   Fall at home, initial encounter  Assessment and Plan: Failure to thrive: continue w/ comfort care only. Still waiting on LTC placement & CM is working on this. Hospice following and recs apprec   DM2: likely well controlled. Comfort care only. Dysphagia III diet as pt has trouble swallowing certain foods sometimes as per pt's family   CKDIIIa: comfort care only    HTN: comfort care only   HLD: comfort care only         DVT prophylaxis: none, comfort care only  Code Status: DNR Family Communication: discussed pt's care w/ pt's family at bedside and answered their questions  Disposition Plan: waiting on placement at LTC w/ hospice. CM is working on this   Level of care: Med-Surg Consultants:  hospice  Procedures:  Antimicrobials:   Subjective: Pt c/o malaise  Objective: Vitals:   06/10/23 1941 06/11/23 0839 06/12/23 0347 06/12/23 0737  BP: (!) 148/57 (!) 159/51 (!) 151/52 (!) 144/52  Pulse: (!) 40 (!) 37 (!) 36 (!) 41  Resp: 20 20 12 16   Temp: 98.2 F (36.8 C) (!) 97.5 F (36.4 C) 97.7 F (36.5 C) (!) 97.4 F (36.3 C)  TempSrc: Oral Oral Oral   SpO2: 96% 99% 97% 97%  Weight:      Height:        Intake/Output Summary (Last 24 hours) at 06/12/2023 0753 Last data filed at 06/11/2023 1029 Gross per 24 hour  Intake 360 ml  Output --  Net 360 ml   Filed Weights   06/08/23 0905  Weight: 63.5 kg    Examination:  General exam: appears calm & comfortable   Respiratory system: decreased breath sounds b/l  Cardiovascular system: S1/S2+. No rubs or clicks  Gastrointestinal system: abd is soft, NT, ND & hypoactive bowel sounds   Central nervous system: judgement and insight appears at baseline     Data Reviewed: I have personally reviewed following labs and imaging studies  CBC: Recent Labs  Lab 06/08/23 0911  WBC 12.8*  NEUTROABS 11.2*  HGB 12.9*  HCT 39.7  MCV 93.0  PLT 233   Basic Metabolic Panel: Recent Labs  Lab 06/08/23 1124  NA 141  K 3.8  CL 108  CO2 21*  GLUCOSE 145*  BUN 20  CREATININE 1.20  CALCIUM 8.4*   GFR: Estimated Creatinine Clearance: 31.6 mL/min (by C-G formula based on SCr of 1.2 mg/dL). Liver Function Tests: Recent Labs  Lab 06/08/23 1124  AST 27  ALT 17  ALKPHOS 45  BILITOT 1.7*  PROT 5.9*  ALBUMIN 3.3*   No results for input(s): "LIPASE", "AMYLASE" in the last 168 hours. No results for input(s): "AMMONIA" in the last 168 hours. Coagulation Profile: No results for input(s): "INR", "PROTIME" in the last 168 hours. Cardiac Enzymes: No results for input(s): "CKTOTAL", "CKMB", "CKMBINDEX", "TROPONINI" in the last 168 hours. BNP (last 3 results) No results for input(s): "PROBNP" in the last 8760 hours. HbA1C: No results for input(s): "HGBA1C" in the last 72 hours. CBG: No results for input(s): "GLUCAP" in the last 168 hours. Lipid Profile: No results for input(s): "CHOL", "HDL", "LDLCALC", "TRIG", "CHOLHDL", "LDLDIRECT"  in the last 72 hours. Thyroid Function Tests: No results for input(s): "TSH", "T4TOTAL", "FREET4", "T3FREE", "THYROIDAB" in the last 72 hours. Anemia Panel: No results for input(s): "VITAMINB12", "FOLATE", "FERRITIN", "TIBC", "IRON", "RETICCTPCT" in the last 72 hours. Sepsis Labs: No results for input(s): "PROCALCITON", "LATICACIDVEN" in the last 168 hours.  No results found for this or any previous visit (from the past 240 hour(s)).       Radiology Studies: No results found.      Scheduled Meds:  feeding supplement  237 mL Oral BID BM   hydrocerin   Topical BID   melatonin  2.5 mg Oral QHS   mupirocin ointment   Topical BID    risperiDONE  1 mg Oral Once   Continuous Infusions:     LOS: 3 days    Time spent: 25 mins     Charise Killian, MD [facility] Pager [phone]  If 7PM-7AM, please contact night-coverage [email or web address] 06/12/2023, 7:53 AM

## 2023-06-12 NOTE — Progress Notes (Signed)
[facility] Referral Note  Follow up on new referral for hospice at LTC upon discharge.  Patient bed offer made today from [facility].  They are able to take patient on Monday- pending family decision and paperwork completion.  Hospital Liaison team will continue to follow through final disposition.  Please do not hesitate to call with any hospice related questions or concerns.  Norris Cross, RN Nurse Liaison [phone]

## 2023-06-12 NOTE — TOC Progression Note (Signed)
Transition of Care [facility]) - Progression Note    Patient Details  Name: Jeffery Avila MRN: [id number] Date of Birth: November 17, 1922  [facility] CM/SW Contact  Allena Katz, LCSW Phone Number: 06/12/2023, 8:56 AM  Clinical Narrative:   Clide Cliff at [facility] reports that he can take patient on Monday if family comes in to sign contract. Family will come on Monday.          Expected Discharge Plan and Services     Post Acute Care Choice: Hospice Living arrangements for the past 2 months: Single Family Home                                       Social Determinants of Health (SDOH) Interventions SDOH Screenings   Food Insecurity: No Food Insecurity (06/09/2023)  Housing: Low Risk  (06/09/2023)  Transportation Needs: No Transportation Needs (06/09/2023)  Utilities: Not At Risk (06/09/2023)  Tobacco Use: Medium Risk (06/10/2023)    Readmission Risk Interventions     No data to display

## 2023-06-12 NOTE — Progress Notes (Signed)
Patient resting peacefully in bed with no complaints of pain.

## 2023-06-13 DIAGNOSIS — R627 Adult failure to thrive: Secondary | ICD-10-CM | POA: Diagnosis not present

## 2023-06-13 DIAGNOSIS — Z515 Encounter for palliative care: Secondary | ICD-10-CM | POA: Diagnosis not present

## 2023-06-13 NOTE — Progress Notes (Signed)
PROGRESS NOTE    KHYAIR BROOKBANK  TDD:[id number] DOB: 07-12-1923 DOA: 06/08/2023 PCP: Patient, No Pcp Per    Assessment & Plan:   Principal Problem:   Comfort measures only status Active Problems:   HTN (hypertension)   Type 2 diabetes mellitus with stage 3a chronic kidney disease, without long-term current use of insulin (HCC)   Fall at home, initial encounter  Assessment and Plan: Failure to thrive: continue w/ comfort care only. Can possibly be d/c to [facility] w/ comfort care only tomorrow   DM2: likely well controlled. Comfort care only. Dysphagia III diet as pt has trouble swallowing certain foods sometimes as per pt's family   CKDIIIa: comfort care only   HTN: comfort care only   HLD: comfort care only         DVT prophylaxis: none, comfort care only  Code Status: DNR Family Communication: discussed pt's care w/ pt's family at bedside and answered their questions  Disposition Plan: can possibly d/c tomorrow to [facility] w/ comfort care only   Level of care: Med-Surg Consultants:  hospice  Procedures:  Antimicrobials:   Subjective: Pt c/o fatigue   Objective: Vitals:   06/11/23 0839 06/12/23 0347 06/12/23 0737 06/13/23 0430  BP: (!) 159/51 (!) 151/52 (!) 144/52 (!) 124/54  Pulse: (!) 37 (!) 36 (!) 41 (!) 40  Resp: 20 12 16 17   Temp: (!) 97.5 F (36.4 C) 97.7 F (36.5 C) (!) 97.4 F (36.3 C) 97.6 F (36.4 C)  TempSrc: Oral Oral    SpO2: 99% 97% 97% 97%  Weight:      Height:        Intake/Output Summary (Last 24 hours) at 06/13/2023 0802 Last data filed at 06/12/2023 1418 Gross per 24 hour  Intake 600 ml  Output --  Net 600 ml   Filed Weights   06/08/23 0905  Weight: 63.5 kg    Examination:  General exam: Appears comfortable  Respiratory system: diminished breath sounds b/l  Cardiovascular system: S1 & S2+. No rubs or clicks Gastrointestinal system: abd is soft, NT, ND & hypoactive bowel sounds  Central nervous system: judgement and  insight appears at baseline      Data Reviewed: I have personally reviewed following labs and imaging studies  CBC: Recent Labs  Lab 06/08/23 0911  WBC 12.8*  NEUTROABS 11.2*  HGB 12.9*  HCT 39.7  MCV 93.0  PLT 233   Basic Metabolic Panel: Recent Labs  Lab 06/08/23 1124  NA 141  K 3.8  CL 108  CO2 21*  GLUCOSE 145*  BUN 20  CREATININE 1.20  CALCIUM 8.4*   GFR: Estimated Creatinine Clearance: 31.6 mL/min (by C-G formula based on SCr of 1.2 mg/dL). Liver Function Tests: Recent Labs  Lab 06/08/23 1124  AST 27  ALT 17  ALKPHOS 45  BILITOT 1.7*  PROT 5.9*  ALBUMIN 3.3*   No results for input(s): "LIPASE", "AMYLASE" in the last 168 hours. No results for input(s): "AMMONIA" in the last 168 hours. Coagulation Profile: No results for input(s): "INR", "PROTIME" in the last 168 hours. Cardiac Enzymes: No results for input(s): "CKTOTAL", "CKMB", "CKMBINDEX", "TROPONINI" in the last 168 hours. BNP (last 3 results) No results for input(s): "PROBNP" in the last 8760 hours. HbA1C: No results for input(s): "HGBA1C" in the last 72 hours. CBG: No results for input(s): "GLUCAP" in the last 168 hours. Lipid Profile: No results for input(s): "CHOL", "HDL", "LDLCALC", "TRIG", "CHOLHDL", "LDLDIRECT" in the last 72 hours. Thyroid Function Tests:  No results for input(s): "TSH", "T4TOTAL", "FREET4", "T3FREE", "THYROIDAB" in the last 72 hours. Anemia Panel: No results for input(s): "VITAMINB12", "FOLATE", "FERRITIN", "TIBC", "IRON", "RETICCTPCT" in the last 72 hours. Sepsis Labs: No results for input(s): "PROCALCITON", "LATICACIDVEN" in the last 168 hours.  No results found for this or any previous visit (from the past 240 hour(s)).       Radiology Studies: No results found.      Scheduled Meds:  feeding supplement  237 mL Oral BID BM   hydrocerin   Topical BID   melatonin  2.5 mg Oral QHS   mupirocin ointment   Topical BID   risperiDONE  1 mg Oral Once    Continuous Infusions:     LOS: 4 days    Time spent: 25 mins     Charise Killian, MD [facility] Pager [phone]  If 7PM-7AM, please contact night-coverage [email or web address] 06/13/2023, 8:02 AM

## 2023-06-14 DIAGNOSIS — Z515 Encounter for palliative care: Secondary | ICD-10-CM | POA: Diagnosis not present

## 2023-06-14 MED ORDER — MORPHINE SULFATE 20 MG/5ML PO SOLN: 2.5000 mg | ORAL | Status: AC | PRN

## 2023-06-14 MED ORDER — GLYCOPYRROLATE 1 MG PO TABS: 1.0000 mg | ORAL_TABLET | ORAL | Status: AC | PRN

## 2023-06-14 MED ORDER — HALOPERIDOL 0.5 MG PO TABS: 0.5000 mg | ORAL_TABLET | ORAL | Status: AC | PRN

## 2023-06-14 NOTE — Progress Notes (Signed)
[facility] Referral Note  Follow up on hospice referral at discharge from [facility].  Patient is discharging to [facility] today.  [facility] will follow patient at [facility].  Thank you for allowing participation in this patient's care.  Please do not hesitate to call with any hospice questions or concerns.  Norris Cross, RN Nurse Liaison [phone]

## 2023-06-14 NOTE — NC FL2 (Signed)
[place] MEDICAID FL2 LEVEL OF CARE FORM     IDENTIFICATION  Patient Name: Jeffery Avila Birthdate: 28-Jan-1923 Sex: male Admission Date (Current Location): 06/08/2023  [place] and [place] Number:  Chiropodist and Address:  [facility], [address], [place], [place] [address]      Provider Number: [id number]  Attending Physician Name and Address:  Charise Killian, MD  Relative Name and Phone Number:       Current Level of Care:   Recommended Level of Care:  (LTC) Prior Approval Number:    Date Approved/Denied:   PASRR Number: [id number]  Discharge Plan: SNF    Current Diagnoses: Patient Active Problem List   Diagnosis Date Noted   Fall at home, initial encounter 06/09/2023   Comfort measures only status 06/08/2023   Hypomagnesemia    HTN (hypertension) 04/14/2020   Type 2 diabetes mellitus with stage 3a chronic kidney disease, without long-term current use of insulin (HCC) 04/14/2020   AF (paroxysmal atrial fibrillation) (HCC) 04/14/2020   Gastroenteritis due to COVID-19 virus 04/14/2020   BPH (benign prostatic hyperplasia) 04/14/2020   Generalized weakness 04/14/2020   Pancytopenia (HCC) 04/14/2020   Recurrent skin cancer 04/14/2020   Suspect early Pneumonia due to COVID-19 virus 04/14/2020    Orientation RESPIRATION BLADDER Height & Weight     Self  Normal Continent Weight: 140 lb (63.5 kg) Height:  5\' 9"  (175.3 cm)  BEHAVIORAL SYMPTOMS/MOOD NEUROLOGICAL BOWEL NUTRITION STATUS      Continent    AMBULATORY STATUS COMMUNICATION OF NEEDS Skin   Limited Assist Verbally Skin abrasions                       Personal Care Assistance Level of Assistance  Bathing, Dressing, Feeding Bathing Assistance: Limited assistance Feeding assistance: Limited assistance Dressing Assistance: Limited assistance     Functional Limitations Info  Sight, Hearing, Speech Sight Info: Adequate Hearing Info: Adequate Speech Info:  Adequate    SPECIAL CARE FACTORS FREQUENCY                       Contractures Contractures Info: Not present    Additional Factors Info  Code Status Code Status Info: DNR             Current Medications (06/14/2023):  This is the current hospital active medication list Current Facility-Administered Medications  Medication Dose Route Frequency Provider Last Rate Last Admin   acetaminophen (TYLENOL) tablet 650 mg  650 mg Oral Q6H PRN Gertha Calkin, MD       Or   acetaminophen (TYLENOL) suppository 650 mg  650 mg Rectal Q6H PRN Gertha Calkin, MD       antiseptic oral rinse (BIOTENE) solution 15 mL  15 mL Topical PRN Gertha Calkin, MD       feeding supplement (ENSURE ENLIVE / ENSURE PLUS) liquid 237 mL  237 mL Oral BID BM Charise Killian, MD   237 mL at 06/14/23 1052   glycopyrrolate (ROBINUL) tablet 1 mg  1 mg Oral Q4H PRN Gertha Calkin, MD       Or   glycopyrrolate (ROBINUL) injection 0.2 mg  0.2 mg Subcutaneous Q4H PRN Gertha Calkin, MD       Or   glycopyrrolate (ROBINUL) injection 0.2 mg  0.2 mg Intravenous Q4H PRN Gertha Calkin, MD       haloperidol (HALDOL) tablet 0.5 mg  0.5 mg  Oral Q4H PRN Gertha Calkin, MD       Or   haloperidol (HALDOL) 2 MG/ML solution 0.5 mg  0.5 mg Sublingual Q4H PRN Gertha Calkin, MD       Or   haloperidol lactate (HALDOL) injection 0.5 mg  0.5 mg Intravenous Q4H PRN Gertha Calkin, MD       hydrocerin (EUCERIN) cream   Topical BID Charise Killian, MD   Given at 06/14/23 1052   melatonin tablet 2.5 mg  2.5 mg Oral QHS Mumma, Carollee Herter, MD       mupirocin ointment (BACTROBAN) 2 %   Topical BID Charise Killian, MD   Given at 06/14/23 1052   ondansetron (ZOFRAN-ODT) disintegrating tablet 4 mg  4 mg Oral Q6H PRN Gertha Calkin, MD       Or   ondansetron [facility]) injection 4 mg  4 mg Intravenous Q6H PRN Gertha Calkin, MD       polyvinyl alcohol (LIQUIFILM TEARS) 1.4 % ophthalmic solution 1 drop  1 drop Both Eyes QID PRN Gertha Calkin, MD        risperiDONE (RISPERDAL) tablet 1 mg  1 mg Oral Once Corena Herter, MD         Discharge Medications: Please see discharge summary for a list of discharge medications.   TAKE these medications     ascorbic acid 500 MG tablet Commonly known as: VITAMIN C Take 1 tablet (500 mg total) by mouth daily.    finasteride 5 MG tablet Commonly known as: PROSCAR Take 5 mg by mouth daily.    fluorouracil 5 % cream Commonly known as: EFUDEX Apply once daily for 4 consecutive days each month.    glycopyrrolate 1 MG tablet Commonly known as: ROBINUL Take 1 tablet (1 mg total) by mouth every 4 (four) hours as needed (excessive secretions).    haloperidol 0.5 MG tablet Commonly known as: HALDOL Take 1 tablet (0.5 mg total) by mouth every 4 (four) hours as needed for agitation (or delirium).    lisinopril 40 MG tablet Commonly known as: ZESTRIL Take 40 mg by mouth.    metFORMIN 500 MG tablet Commonly known as: GLUCOPHAGE Take by mouth daily with breakfast.    morphine 20 MG/5ML solution Take 0.6 mLs (2.4 mg total) by mouth every 2 (two) hours as needed for pain.    simvastatin 80 MG tablet Commonly known as: ZOCOR Take 80 mg by mouth.    zinc sulfate 220 (50 Zn) MG capsule Take 1 capsule (220 mg total) by mouth daily.        Relevant Imaging Results:  Relevant Lab Results:   Additional Information [id number]  Allena Katz, LCSW

## 2023-06-14 NOTE — Discharge Summary (Signed)
Physician Discharge Summary  Jeffery Avila ZOX:[id number] DOB: 06/10/1923 DOA: 06/08/2023  PCP: Patient, No Pcp Per  Admit date: 06/08/2023 Discharge date: 06/14/2023  Admitted From: home Disposition:  [facility] w/ comfort care only/hospice   Recommendations for Outpatient Follow-up:  Follow up w/ hospice provider ASAP  Home Health: no  Equipment/Devices:  Discharge Condition: comfort care  CODE STATUS: DNR Diet recommendation: soft diet   Brief/Interim Summary: HPI was taken from Dr. Irena Cords: Jeffery Avila is a 89 y.o. male with medical history significant for hypertensionGERD, multiple skin cancers presenting with falls from home. Comfort care only. DNR /DNI falls over past days.  Pt wants to go to hospice house.  Called Son Rocky Link Sayed who says he is it for [employer].  Per son  "dad needs hearing aids.  He has been falling x past few weeks.  He can't stand up and needs assistance.  He lives by himself and can't any more.  Pt had wedged himself in the bed and night stand. He needs assistance complete care.  He has lost weight - 75 pounds.  Heart rate is low.  Pt is frail. Today am son found him and is not capable of taking care of himself.  Pt has home health and home hospice.  Son wants hospice.  Son states he does not have any hospice services currently.  Son does not want him to be treatment.  Son wants me to make him comfort measures. Son wants Comfort measures only per pt wishes And to call him if any questions if any ok with Son: Jeffery Avila [phone]. D/W son that his prognosis is guarded and terminal . " He understands that his HR is low and may cause arrhythmia / asystole.  Son wants wound care. And provide sips as needed if pt wants .  I have discussed with son and final plan of comfort measures and understands that dad may pass tonight and he says " we will rejoice " if dad passes and follow his wishes of comfort care.    Discharge Diagnoses:   Principal Problem:   Comfort measures only status Active Problems:   HTN (hypertension)   Type 2 diabetes mellitus with stage 3a chronic kidney disease, without long-term current use of insulin (HCC)   Fall at home, initial encounter Failure to thrive: continue w/ comfort care only. Can possibly be d/c to [facility] w/ comfort care only tomorrow   DM2: likely well controlled. Comfort care only. Dysphagia III diet as pt has trouble swallowing certain foods sometimes as per pt's family   CKDIIIa: comfort care only   HTN: comfort care only   HLD: comfort care only    Discharge Instructions  Discharge Instructions     Discharge instructions   Complete by: As directed    F/u w/ hospice provider ASAP   Discharge wound care:   Complete by: As directed    Wound care  2 times daily      Comments: Clean L temple and R ear wounds with NS, apply Mupirocin ointment to areas twice daily. May leave open to air after applying Mupirocin.   Increase activity slowly   Complete by: As directed       Allergies as of 06/14/2023   No Known Allergies      Medication List     TAKE these medications    ascorbic acid 500 MG tablet Commonly known as: VITAMIN C Take 1 tablet (500 mg total) by mouth daily.  finasteride 5 MG tablet Commonly known as: PROSCAR Take 5 mg by mouth daily.   fluorouracil 5 % cream Commonly known as: EFUDEX Apply once daily for 4 consecutive days each month.   glycopyrrolate 1 MG tablet Commonly known as: ROBINUL Take 1 tablet (1 mg total) by mouth every 4 (four) hours as needed (excessive secretions).   haloperidol 0.5 MG tablet Commonly known as: HALDOL Take 1 tablet (0.5 mg total) by mouth every 4 (four) hours as needed for agitation (or delirium).   lisinopril 40 MG tablet Commonly known as: ZESTRIL Take 40 mg by mouth.   metFORMIN 500 MG tablet Commonly known as: GLUCOPHAGE Take by mouth daily with breakfast.   morphine 20 MG/5ML solution Take  0.6 mLs (2.4 mg total) by mouth every 2 (two) hours as needed for pain.   simvastatin 80 MG tablet Commonly known as: ZOCOR Take 80 mg by mouth.   zinc sulfate 220 (50 Zn) MG capsule Take 1 capsule (220 mg total) by mouth daily.               Discharge Care Instructions  (From admission, onward)           Start     Ordered   06/14/23 0000  Discharge wound care:       Comments: Wound care  2 times daily      Comments: Clean L temple and R ear wounds with NS, apply Mupirocin ointment to areas twice daily. May leave open to air after applying Mupirocin.   06/14/23 1134            No Known Allergies  Consultations: Palliative care   Procedures/Studies: CT ABDOMEN PELVIS WO CONTRAST  Result Date: 06/08/2023 CLINICAL DATA:  Acute, non localized abdominal pain. Multiple falls over the past few days. Decreased appetite over the past week. EXAM: CT ABDOMEN AND PELVIS WITHOUT CONTRAST TECHNIQUE: Multidetector CT imaging of the abdomen and pelvis was performed following the standard protocol without IV contrast. RADIATION DOSE REDUCTION: This exam was performed according to the departmental dose-optimization program which includes automated exposure control, adjustment of the mA and/or kV according to patient size and/or use of iterative reconstruction technique. COMPARISON:  09/08/2013. FINDINGS: Lower chest: Moderate-sized right pleural effusion and smaller left pleural effusion. Adjacent bilateral compressive atelectasis, left greater than right. A huge hiatal hernia is again demonstrated, this time containing stomach, colon and a portion of the pancreas. Hepatobiliary: Dilated gallbladder containing multiple tiny dependent calculi. No gallbladder wall thickening or pericholecystic fluid. Small calcified granuloma in the liver. Pancreas: Mildly atrophied. Spleen: Normal in size without focal abnormality. Adrenals/Urinary Tract: Large right renal cyst and smaller cyst with a mild  increase in size. These both continued have appearances compatible with simple cysts and do not need imaging follow-up. Unremarkable adrenal glands, ureters and urinary bladder. 2 mm left renal calculus. Stomach/Bowel: The entire stomach is in the large hiatal hernia as well as a portion of the splenic flexure, distal transverse colon and proximal descending colon. Multiple descending and sigmoid colon diverticula without evidence of diverticulitis. Surgically absent appendix. Unremarkable small bowel. Vascular/Lymphatic: Atheromatous arterial calcifications without aneurysm. No enlarged lymph nodes. Reproductive: Coarse calcifications in a normal-sized prostate gland. Other: Small bilateral inguinal hernias containing fat. Diffuse subcutaneous edema. No free peritoneal fluid. Musculoskeletal: Lumbar and lower thoracic spine degenerative changes. IMPRESSION: 1. Moderate-sized right pleural effusion and smaller left pleural effusion with adjacent bilateral compressive atelectasis, left greater than right. 2. Huge hiatal containing the entire stomach  and portions of the pancreas and colon. 3. Cholelithiasis with a dilated gallbladder. No gallbladder wall thickening or pericholecystic fluid. 4. 2 mm nonobstructing left renal calculus. 5. Diffuse subcutaneous edema. Electronically Signed   By: Beckie Salts M.D.   On: 06/08/2023 16:05   DG Chest Port 1 View  Result Date: 06/08/2023 CLINICAL DATA:  Weakness EXAM: PORTABLE CHEST 1 VIEW COMPARISON:  Chest x-ray 04/14/2020 FINDINGS: Large hiatal hernia is present. Small bilateral pleural effusions and minimal bibasilar opacities are present. The cardiomediastinal silhouette is grossly within normal limits. There is no pneumothorax or acute fracture. IMPRESSION: 1. Small bilateral pleural effusions and minimal bibasilar opacities, likely atelectasis. 2. Large hiatal hernia. Electronically Signed   By: Darliss Cheney M.D.   On: 06/08/2023 15:16   CT Head Wo  Contrast  Result Date: 06/08/2023 CLINICAL DATA:  89 year old male with multiple recent falls. Decreased p.o. Found down. EXAM: CT HEAD WITHOUT CONTRAST TECHNIQUE: Contiguous axial images were obtained from the base of the skull through the vertex without intravenous contrast. RADIATION DOSE REDUCTION: This exam was performed according to the departmental dose-optimization program which includes automated exposure control, adjustment of the mA and/or kV according to patient size and/or use of iterative reconstruction technique. COMPARISON:  Head CT 06/23/2018. FINDINGS: Brain: Cerebral volume is within normal limits for age. No midline shift, ventriculomegaly, mass effect, evidence of mass lesion, intracranial hemorrhage or evidence of cortically based acute infarction. Chronic asymmetric left mesial temporal lobe volume loss is stable and nonspecific (coronal image 35). No discrete cortical encephalomalacia. And gray-white differentiation remains within normal limits for age. Vascular: No suspicious intracranial vascular hyperdensity. Calcified atherosclerosis at the skull base. Skull: No acute osseous abnormality identified. Sinuses/Orbits: Stable paranasal sinus aeration with scattered mild mucosal thickening and/or mucous retention cysts. New polypoid opacity in the posterior right nasal cavity is up to 2.3 cm on series 3, image 7. Tympanic cavities and mastoids remain clear. Other: No orbit or scalp soft tissue injury identified. IMPRESSION: 1. No acute intracranial abnormality or traumatic injury identified. 2. New 2.3 cm polypoid opacity in the posterior right nasal cavity since 2019. This might be retained secretions but a right nasal polyp is not excluded. Electronically Signed   By: Odessa Fleming M.D.   On: 06/08/2023 10:42   DG Foot 2 Views Right  Result Date: 06/08/2023 CLINICAL DATA:  Right foot pain after fall. EXAM: RIGHT FOOT - 2 VIEW COMPARISON:  None Available. FINDINGS: There is no evidence of  fracture or dislocation. Mild degenerative change for age. Sclerotic density in the great toe distal phalanx has a nonaggressive appearance. Slight hammertoe deformity of the toes. Incidental os peroneal. Plantar calcaneal spur and Achilles tendon enthesophyte. Mild soft tissue edema. IMPRESSION: 1. No acute fracture or dislocation of the right foot. 2. Mild degenerative change for age. Electronically Signed   By: Narda Rutherford M.D.   On: 06/08/2023 10:37   DG Forearm Left  Result Date: 06/08/2023 CLINICAL DATA:  Fall with laceration. EXAM: LEFT FOREARM - 2 VIEW COMPARISON:  None Available. FINDINGS: No fracture of the radius or ulna. Moderate to advanced chronic degenerative change of the wrist. No wrist or elbow dislocation. Punctate density in the soft tissues adjacent to the volar mid ulna may be a chronic calcification or tiny foreign body. No soft tissue gas. Mild generalized soft tissue edema. IMPRESSION: 1. No fracture of the radius or ulna. 2. Punctate density in the soft tissues adjacent to the volar mid ulna may be a  chronic calcification or tiny foreign body. 3. Chronic wrist arthropathy. Electronically Signed   By: Narda Rutherford M.D.   On: 06/08/2023 10:36   (Echo, Carotid, EGD, Colonoscopy, ERCP)    Subjective: Pt denies complaints   Discharge Exam: Vitals:   06/13/23 0430 06/14/23 0812  BP: (!) 124/54 (!) 150/53  Pulse: (!) 40 (!) 39  Resp: 17 16  Temp: 97.6 F (36.4 C) 98.1 F (36.7 C)  SpO2: 97% 99%   Vitals:   06/12/23 0347 06/12/23 0737 06/13/23 0430 06/14/23 0812  BP: (!) 151/52 (!) 144/52 (!) 124/54 (!) 150/53  Pulse: (!) 36 (!) 41 (!) 40 (!) 39  Resp: 12 16 17 16   Temp: 97.7 F (36.5 C) (!) 97.4 F (36.3 C) 97.6 F (36.4 C) 98.1 F (36.7 C)  TempSrc: Oral     SpO2: 97% 97% 97% 99%  Weight:      Height:        General: Pt is alert, awake, not in acute distress Cardiovascular: S1/S2 +, no rubs, no gallops Respiratory: CTA bilaterally, no wheezing, no  rhonchi Abdominal: Soft, NT, ND, bowel sounds + Extremities: no cyanosis    The results of significant diagnostics from this hospitalization (including imaging, microbiology, ancillary and laboratory) are listed below for reference.     Microbiology: No results found for this or any previous visit (from the past 240 hour(s)).   Labs: BNP (last 3 results) No results for input(s): "BNP" in the last 8760 hours. Basic Metabolic Panel: Recent Labs  Lab 06/08/23 1124  NA 141  K 3.8  CL 108  CO2 21*  GLUCOSE 145*  BUN 20  CREATININE 1.20  CALCIUM 8.4*   Liver Function Tests: Recent Labs  Lab 06/08/23 1124  AST 27  ALT 17  ALKPHOS 45  BILITOT 1.7*  PROT 5.9*  ALBUMIN 3.3*   No results for input(s): "LIPASE", "AMYLASE" in the last 168 hours. No results for input(s): "AMMONIA" in the last 168 hours. CBC: Recent Labs  Lab 06/08/23 0911  WBC 12.8*  NEUTROABS 11.2*  HGB 12.9*  HCT 39.7  MCV 93.0  PLT 233   Cardiac Enzymes: No results for input(s): "CKTOTAL", "CKMB", "CKMBINDEX", "TROPONINI" in the last 168 hours. BNP: Invalid input(s): "POCBNP" CBG: No results for input(s): "GLUCAP" in the last 168 hours. D-Dimer No results for input(s): "DDIMER" in the last 72 hours. Hgb A1c No results for input(s): "HGBA1C" in the last 72 hours. Lipid Profile No results for input(s): "CHOL", "HDL", "LDLCALC", "TRIG", "CHOLHDL", "LDLDIRECT" in the last 72 hours. Thyroid function studies No results for input(s): "TSH", "T4TOTAL", "T3FREE", "THYROIDAB" in the last 72 hours.  Invalid input(s): "FREET3" Anemia work up No results for input(s): "VITAMINB12", "FOLATE", "FERRITIN", "TIBC", "IRON", "RETICCTPCT" in the last 72 hours. Urinalysis    Component Value Date/Time   COLORURINE Yellow 09/08/2013 0933   APPEARANCEUR Clear 09/08/2013 0933   LABSPEC 1.018 09/08/2013 0933   PHURINE 6.0 09/08/2013 0933   GLUCOSEU Negative 09/08/2013 0933   HGBUR Negative 09/08/2013 0933    BILIRUBINUR Negative 09/08/2013 0933   KETONESUR Negative 09/08/2013 0933   PROTEINUR Negative 09/08/2013 0933   NITRITE Negative 09/08/2013 0933   LEUKOCYTESUR Negative 09/08/2013 0933   Sepsis Labs Recent Labs  Lab 06/08/23 0911  WBC 12.8*   Microbiology No results found for this or any previous visit (from the past 240 hour(s)).   Time coordinating discharge: Over 30 minutes  SIGNED:   Charise Killian, MD  [facility] 06/14/2023,  11:35 AM Pager   If 7PM-7AM, please contact night-coverage [email or web address]

## 2023-06-14 NOTE — TOC Transition Note (Addendum)
Transition of Care [facility]) - CM/SW Discharge Note   Patient Details  Name: Jeffery Avila MRN: [id number] Date of Birth: 25-Nov-1922  [facility] CM/SW Contact:  Allena Katz, LCSW Phone Number: 06/14/2023, 12:05 PM   Clinical Narrative:   Pt has orders to [facility] to [facility]. RN given number for report. Son requesting for 3pm transport. Medical necessity printed to the unit. CSW left voicemail with son ken to let him know patient was second on the list.           Patient Goals and CMS Choice CMS Medicare.gov Compare Post Acute Care list provided to:: Patient Represenative (must comment) Choice offered to / list presented to : Adult Children  Discharge Placement                         Discharge Plan and Services Additional resources added to the After Visit Summary for       Post Acute Care Choice: Hospice                               Social Determinants of Health (SDOH) Interventions SDOH Screenings   Food Insecurity: No Food Insecurity (06/09/2023)  Housing: Low Risk  (06/09/2023)  Transportation Needs: No Transportation Needs (06/09/2023)  Utilities: Not At Risk (06/09/2023)  Tobacco Use: Medium Risk (06/10/2023)     Readmission Risk Interventions     No data to display

## 2023-06-14 NOTE — Care Management Important Message (Signed)
Important Message  Patient Details  Name: Jeffery Avila MRN: [id number] Date of Birth: 1922/12/29   Medicare Important Message Given:  Other (see comment)  Patient is on comfort care and will discharge with hospice. Out of respect for the patient and family no Important Message from [facility] given.  Olegario Messier A Shanna Strength 06/14/2023, 9:08 AM

## 2023-06-14 NOTE — Progress Notes (Signed)
Patient being discharged to [facility] with Hospice. Called report to [place], nurse at [facility]. All paper work in discharged packet with DNR. Patient will be transported via EMS.

## 2023-10-01 ENCOUNTER — Emergency Department

## 2023-10-01 ENCOUNTER — Emergency Department
Admission: EM | Admit: 2023-10-01 | Discharge: 2023-10-03 | Disposition: A | Discharge status: Home / Self Care | Attending: Emergency Medicine | Admitting: Emergency Medicine

## 2023-10-01 ENCOUNTER — Other Ambulatory Visit: Payer: Self-pay

## 2023-10-01 DIAGNOSIS — Z85828 Personal history of other malignant neoplasm of skin: Secondary | ICD-10-CM | POA: Diagnosis not present

## 2023-10-01 DIAGNOSIS — S0990XA Unspecified injury of head, initial encounter: Secondary | ICD-10-CM | POA: Diagnosis present

## 2023-10-01 DIAGNOSIS — I10 Essential (primary) hypertension: Secondary | ICD-10-CM | POA: Insufficient documentation

## 2023-10-01 DIAGNOSIS — S0101XA Laceration without foreign body of scalp, initial encounter: Secondary | ICD-10-CM | POA: Insufficient documentation

## 2023-10-01 DIAGNOSIS — W19XXXA Unspecified fall, initial encounter: Secondary | ICD-10-CM | POA: Insufficient documentation

## 2023-10-01 LAB — BASIC METABOLIC PANEL
Anion gap: 9 (ref 5–15)
BUN: 14 mg/dL (ref 8–23)
CO2: 26 mmol/L (ref 22–32)
Calcium: 8.4 mg/dL — ABNORMAL LOW (ref 8.9–10.3)
Chloride: 108 mmol/L (ref 98–111)
Creatinine, Ser: 1.03 mg/dL (ref 0.61–1.24)
GFR, Estimated: >60 mL/min (ref >60)
Glucose, Bld: 142 mg/dL — ABNORMAL HIGH (ref 70–99)
Potassium: 3.3 mmol/L — ABNORMAL LOW (ref 3.5–5.1)
Sodium: 143 mmol/L (ref 135–145)

## 2023-10-01 LAB — CBC
HCT: 38.6 % — ABNORMAL LOW (ref 39.0–52.0)
Hemoglobin: 12.7 g/dL — ABNORMAL LOW (ref 13.0–17.0)
MCH: 30.1 pg (ref 26.0–34.0)
MCHC: 32.9 g/dL (ref 30.0–36.0)
MCV: 91.5 fL (ref 80.0–100.0)
Platelets: 238 10*3/uL (ref 150–400)
RBC: 4.22 MIL/uL (ref 4.22–5.81)
RDW: 15.7 % — ABNORMAL HIGH (ref 11.5–15.5)
WBC: 8.2 10*3/uL (ref 4.0–10.5)
nRBC: 0 % (ref 0.0–0.2)

## 2023-10-01 NOTE — Progress Notes (Addendum)
[facility] Liaison note:   This patient is a current [facility] patient. [facility] will continue to follow for discharge disposition.    Please call for any Hospice  related questions or concerns.   [facility] Liaison [phone]

## 2023-10-01 NOTE — ED Provider Notes (Signed)
[facility] Provider Note    Event Date/Time   First MD Initiated Contact with Patient 10/01/23 1102     (approximate)   History   Fall   HPI  Jeffery Avila is a 89 y.o. male with history of hypertension, GERD, and skin cancer who presents with a scalp laceration after an unwitnessed fall.  Per the patient's grandson, he has had several falls recently.  He has not needed medical evaluation for most of these falls, but today because he had a laceration and bleeding he was sent to the hospital.  The patient is currently in a nursing facility and is on hospice with hospice nurses visiting several times per week.  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  The patient himself reports pain to the back of his head where the laceration is, but denies other complaints.  I reviewed the past medical records.  The patient was admitted to the hospitalist service in August with pleural effusion and bradycardia.  He was comfort care at that time.   Physical Exam   Triage Vital Signs: ED Triage Vitals  Encounter Vitals Group     BP 10/01/23 0941 (!) 206/63     Systolic BP Percentile --      Diastolic BP Percentile --      Pulse Rate 10/01/23 0941 (!) 41     Resp 10/01/23 0941 18     Temp 10/01/23 0941 97.9 F (36.6 C)     Temp src --      SpO2 10/01/23 0941 98 %     Weight 10/01/23 0938 170 lb (77.1 kg)     Height 10/01/23 0938 5\' 9"  (1.753 m)     Head Circumference --      Peak Flow --      Pain Score --      Pain Loc --      Pain Education --      Exclude from Growth Chart --     Most recent vital signs: Vitals:   10/01/23 0941  BP: (!) 206/63  Pulse: (!) 41  Resp: 18  Temp: 97.9 F (36.6 C)  SpO2: 98%     General: Alert, no distress.  CV:  Good peripheral perfusion.  Resp:  Normal effort.  Abd:  No distention.  Other:  EOMI.  PERRLA.  No facial droop.  Motor intact in all extremities.  Dry mucous membranes.  4 cm scalp laceration to  the left parietal scalp.  No contamination or foreign material.   ED Results / Procedures / Treatments   Labs (all labs ordered are listed, but only abnormal results are displayed) Labs Reviewed  CBC - Abnormal; Notable for the following components:      Result Value   Hemoglobin 12.7 (*)    HCT 38.6 (*)    RDW 15.7 (*)    All other components within normal limits  BASIC METABOLIC PANEL - Abnormal; Notable for the following components:   Potassium 3.3 (*)    Glucose, Bld 142 (*)    Calcium 8.4 (*)    All other components within normal limits     EKG  ED ECG REPORT I, Dionne Bucy, the attending physician, personally viewed and interpreted this ECG.  Date: 10/01/2023 EKG Time: 09 47 Rate: 39 Rhythm: Sinus bradycardia (read by machine is junctional due to poor baseline) QRS Axis: normal Intervals: RBBB ST/T Wave abnormalities: normal Narrative Interpretation: Sinus bradycardia with no evidence of  acute ischemia    RADIOLOGY  CT head/cervical spine: I independently viewed and interpreted the images; there is no ICH.  Radiology report indicates no acute abnormalities.  IMPRESSION:  1. No evidence of acute intracranial abnormality or cervical spine  fracture.  2. Large bilateral pleural effusions.    PROCEDURES:  Critical Care performed: No  .Laceration Repair  Date/Time: 10/01/2023 11:49 AM  Performed by: Dionne Bucy, MD Authorized by: Dionne Bucy, MD   Consent:    Consent obtained:  Verbal   Consent given by:  Healthcare agent Universal protocol:    Patient identity confirmed:  Verbally with patient Anesthesia:    Anesthesia method:  Local infiltration   Local anesthetic:  Lidocaine 1% WITH epi Laceration details:    Location:  Scalp   Scalp location:  L parietal   Length (cm):  4   Depth (mm):  3 Exploration:    Imaging outcome: foreign body not noted     Wound exploration: entire depth of wound visualized     Contaminated:  no   Treatment:    Area cleansed with:  Povidone-iodine   Amount of cleaning:  Standard   Debridement:  None Skin repair:    Repair method:  Staples   Number of staples:  9 Approximation:    Approximation:  Close Repair type:    Repair type:  Simple Post-procedure details:    Dressing:  Sterile dressing   Procedure completion:  Tolerated well, no immediate complications    MEDICATIONS ORDERED IN ED: Medications - No data to display   IMPRESSION / MDM / ASSESSMENT AND PLAN / ED COURSE  I reviewed the triage vital signs and the nursing notes.  89 year old male with PMH as noted above, currently on hospice care who presents with a scalp laceration after an unwitnessed fall.  The patient does not demonstrate other injuries.  Neurologic exam is nonfocal.  He is hypertensive and bradycardic, however asymptomatic from these; the heart rate is baseline for the patient.  Neurologic exam is nonfocal.  Differential diagnosis includes, but is not limited to, minor head injury, concussion, ICH.  CT head and cervical spine are negative.  BMP and CBC show no concerning acute findings.  Laceration has been repaired.  At this time, the patient is stable for discharge back to his facility.  I counseled the family members on the results of the workup and on return precautions; they expressed understanding.  Patient's presentation is most consistent with acute complicated illness / injury requiring diagnostic workup.     FINAL CLINICAL IMPRESSION(S) / ED DIAGNOSES   Final diagnoses:  Laceration of scalp, initial encounter  Injury of head, initial encounter     Rx / DC Orders   ED Discharge Orders     None        Note:  This document was prepared using Dragon voice recognition software and may include unintentional dictation errors.    Dionne Bucy, MD 10/01/23 1158

## 2023-10-01 NOTE — ED Triage Notes (Signed)
Pt to ED [facility] from [facility] for fall today. Unknown what caused fall. Pt with laceration to back of head, bleeding controlled. No known blood thinners.  Bradycardia at baseline per EMS

## 2023-10-01 NOTE — Discharge Instructions (Signed)
CT scans were obtained of the head and cervical spine, which are negative for any bleeding or fractures.  The staples should be removed in 7 to 10 days.  Jeffery Avila should return to the ER for any new, worsening, or persistent severe bleeding, swelling, pus drainage or any other signs of infection around the wound, or any other new or worsening symptoms that are concerning.

## 2023-10-20 DEATH — deceased
